# Patient Record
Sex: Male | Born: 2000 | Race: White | Hispanic: No | Marital: Single | Smoking: Never smoker
Health system: Southern US, Community
[De-identification: ages and names within clinical notes are randomized; demographics above are authoritative.]

## PROBLEM LIST (undated history)

## (undated) DIAGNOSIS — F909 Attention-deficit hyperactivity disorder, unspecified type: Secondary | ICD-10-CM

## (undated) HISTORY — DX: Attention-deficit hyperactivity disorder, unspecified type: F90.9

---
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
## 2021-06-15 ENCOUNTER — Encounter (HOSPITAL_COMMUNITY): Payer: Self-pay | Admitting: Emergency Medicine

## 2021-06-15 ENCOUNTER — Emergency Department (HOSPITAL_COMMUNITY)
Admission: EM | Admit: 2021-06-15 | Discharge: 2021-06-15 | Disposition: A | Discharge status: Home / Self Care | Payer: 59 | Attending: Emergency Medicine | Admitting: Emergency Medicine

## 2021-06-15 DIAGNOSIS — R040 Epistaxis: Secondary | ICD-10-CM | POA: Diagnosis not present

## 2021-06-15 NOTE — ED Provider Notes (Signed)
[facility] EMERGENCY DEPARTMENT Provider Note   CSN: [id number] Arrival date & time: 06/15/21  0623     History No chief complaint on file.   Cody Chandler is a 20 y.o. male presenting for evaluation of recurrent nosebleeds.  Patient states he has had 4 nosebleeds this week.  No associated nasal congestion or URI symptoms.  No trauma.  Bleeding will last for few minutes, stop with direct pressure.  No previous history of bleeding.  Occasionally after bleeding, more evaluated right ankle headache, but this is not present currently.  No vision changes.  No numbness or tingling.  He has no other medical problems, takes medications daily.  Not on blood thinners.  HPI     Past Medical History:  Diagnosis Date   ADHD (attention deficit hyperactivity disorder)     There are no problems to display for this patient.   History reviewed. No pertinent surgical history.     No family history on file.  Social History   Tobacco Use   Smoking status: Never   Smokeless tobacco: Never  Substance Use Topics   Alcohol use: No   Drug use: No    Home Medications Prior to Admission medications   Medication Sig Start Date End Date Taking? Authorizing Provider  acetaminophen (TYLENOL) 325 MG tablet Take 2 tablets (650 mg total) by mouth every 6 (six) hours as needed for mild pain or moderate pain. 08/02/18   Sherrilee Gilles, NP  amphetamine-dextroamphetamine (ADDERALL XR) 20 MG 24 hr capsule Take 20 mg by mouth daily.    [provider]  ibuprofen (ADVIL,MOTRIN) 600 MG tablet Take 1 tablet (600 mg total) by mouth every 6 (six) hours as needed for mild pain or moderate pain. 08/02/18   Sherrilee Gilles, NP    Allergies    Patient has no known allergies.  Review of Systems   Review of Systems  HENT:  Positive for nosebleeds (resolved).   Neurological:  Positive for headaches (resolved).   Physical Exam Updated Vital Signs BP (!) 146/74 (BP  Location: Right Arm)   Pulse 79   Temp 98.2 F (36.8 C) (Oral)   Resp 14   SpO2 100%   Physical Exam Vitals and nursing note reviewed.  Constitutional:      General: He is not in acute distress.    Appearance: Normal appearance.     Comments: Sitting in the bed in NAD  HENT:     Head: Normocephalic and atraumatic.     Nose:     Comments: No active bleeding.  No laceration or obvious source of bleeding. Eyes:     Conjunctiva/sclera: Conjunctivae normal.     Pupils: Pupils are equal, round, and reactive to light.  Cardiovascular:     Rate and Rhythm: Normal rate and regular rhythm.     Pulses: Normal pulses.  Pulmonary:     Effort: Pulmonary effort is normal. No respiratory distress.     Breath sounds: Normal breath sounds. No wheezing.     Comments: Speaking in full sentences.  Clear lung sounds in all fields. Abdominal:     General: There is no distension.     Palpations: Abdomen is soft.     Tenderness: There is no abdominal tenderness.  Musculoskeletal:        General: Normal range of motion.     Cervical back: Normal range of motion and neck supple.  Skin:    General: Skin is warm and dry.  Capillary Refill: Capillary refill takes less than 2 seconds.  Neurological:     Mental Status: He is alert and oriented to person, place, and time.  Psychiatric:        Mood and Affect: Mood and affect normal.        Speech: Speech normal.        Behavior: Behavior normal.    ED Results / Procedures / Treatments   Labs (all labs ordered are listed, but only abnormal results are displayed) Labs Reviewed - No data to display  EKG None  Radiology No results found.  Procedures Procedures   Medications Ordered in ED Medications - No data to display  ED Course  I have reviewed the triage vital signs and the nursing notes.  Pertinent labs & imaging results that were available during my care of the patient were reviewed by me and considered in my medical decision  making (see chart for details).    MDM Rules/Calculators/A&P                           Patient presenting for evaluation of intermittent recurrent nosebleeds.  On exam, patient appears nontoxic.  No active bleeding.  No headache currently.  No other associated neurologic symptoms.  As such, doubt dural sinus thrombosis or other emergent vascular abnormality.  Discussed treatment of nosebleeds with patient as well as warning signs.  Encouraged patient to follow-up with ear nose and throat.  At this time, patient appears safe for discharge.  Return precautions given.  Patient states he understands and agrees to plan  Final Clinical Impression(s) / ED Diagnoses Final diagnoses:  Epistaxis    Rx / DC Orders ED Discharge Orders     None        Alveria Apley, PA-C 06/15/21 1650    Terald Sleeper, MD 06/16/21 [phone]

## 2021-06-15 NOTE — Discharge Instructions (Addendum)
Use afrin and direct pressure if bleeding recurs.  Call the ear, nose and throat doctor listed below to set up a follow-up appointment. If you have bleeding that lasts for more than 20 minutes after using Afrin and direct pressure, return to the ER.  If you develop severe worsening headaches, dizziness, lightheadedness, vision changes, return to the ER.  Return with any new, worsening, concerning symptoms.

## 2021-06-15 NOTE — ED Triage Notes (Signed)
Pt reports nosebleed x 4 since yesterday.  Bleeding stops with direct pressure.  No bleeding at present.  Reports R sided headache with nosebleed.

## 2021-06-15 NOTE — ED Notes (Signed)
Discharged by PA at triage. 

## 2022-04-16 ENCOUNTER — Other Ambulatory Visit: Payer: Self-pay

## 2022-04-16 ENCOUNTER — Emergency Department
Admission: EM | Admit: 2022-04-16 | Discharge: 2022-04-16 | Disposition: A | Discharge status: Home / Self Care | Payer: Self-pay | Attending: Emergency Medicine | Admitting: Emergency Medicine

## 2022-04-16 ENCOUNTER — Emergency Department: Payer: Self-pay

## 2022-04-16 DIAGNOSIS — W228XXA Striking against or struck by other objects, initial encounter: Secondary | ICD-10-CM | POA: Insufficient documentation

## 2022-04-16 DIAGNOSIS — S0990XA Unspecified injury of head, initial encounter: Secondary | ICD-10-CM | POA: Insufficient documentation

## 2022-04-16 MED ORDER — ONDANSETRON 4 MG PO TBDP
4.0000 mg | ORAL_TABLET | Freq: Once | ORAL | Status: AC
  Administered 2022-04-16: 4 mg via ORAL

## 2022-04-16 MED ORDER — ONDANSETRON 4 MG PO TBDP: 4.0000 mg | ORAL_TABLET | Freq: Three times a day (TID) | ORAL | 0 refills | Status: DC | PRN

## 2022-04-16 MED ORDER — KETOROLAC TROMETHAMINE 60 MG/2ML IM SOLN
30.0000 mg | Freq: Once | INTRAMUSCULAR | Status: AC
  Administered 2022-04-16: 30 mg via INTRAMUSCULAR
  Filled 2022-04-16: qty 2

## 2022-04-16 MED ORDER — ONDANSETRON 4 MG PO TBDP
2.0000 mg | ORAL_TABLET | Freq: Once | ORAL | Status: DC
  Filled 2022-04-16: qty 1

## 2022-04-16 NOTE — ED Triage Notes (Signed)
Pt here after hitting his head several times. Pt states he hit it on a shelf Sun, a bed frame fell on it yesterday, and hit it on his dog's crate today. Pt cc/o head pain. Pt ambulatory to triage.

## 2022-04-16 NOTE — Discharge Instructions (Signed)
Please take Tylenol or ibuprofen for headache.  Use the Zofran if needed for nausea.  Do not drive or operate heavy machinery if you are experiencing any concerns such as dizziness.  Return to the emergency department for symptoms that change or worsen if you are unable to schedule an appointment with primary care.

## 2022-04-16 NOTE — ED Provider Notes (Signed)
[facility] Provider Note    Event Date/Time   First MD Initiated Contact with Patient 04/16/22 1509     (approximate)   History   Headache   HPI  Cody Chandler is a 21 y.o. male with no significant past medical history presents to the emergency department for treatment and evaluation after he has his head 3 separate times over the past 3 days.  He states that he hit the back of his helmet on the shelf on Sunday then a bed frame fell on it yesterday and then today, he hit it on his dogs crate.  He has a frontal headache.  He states that he has vomited approximately 4 times.  He has felt dizzy intermittently since hitting his head the first time.  He states that his vision appears to be "like in 3D."  No previous headache syndrome/migraines.  He has not lost consciousness with any of these incidents.  He denies feeling confused or off balance.  No alleviating measures attempted prior to arrival.  Past Medical History:  Diagnosis Date   ADHD (attention deficit hyperactivity disorder)        Physical Exam   Triage Vital Signs: ED Triage Vitals [04/16/22 1444]  Enc Vitals Group     BP 130/88     Pulse Rate 73     Resp 16     Temp 98.3 F (36.8 C)     Temp Source Oral     SpO2 100 %     Weight 175 lb (79.4 kg)     Height 5\' 10"  (1.778 m)     Head Circumference      Peak Flow      Pain Score 4     Pain Loc      Pain Edu?      Excl. in GC?     Most recent vital signs: Vitals:   04/16/22 1444  BP: 130/88  Pulse: 73  Resp: 16  Temp: 98.3 F (36.8 C)  SpO2: 100%    General: Awake, no distress.  CV:  Good peripheral perfusion.  Resp:  Normal effort.  Abd:  No distention.  Other:  Neuro exam unremarkable.  Pupils equal, round, reactive to light and brisk.  Negative Romberg.  Motor and sensory function is normal.  Demonstrates steady, unassisted gait.   ED Results / Procedures / Treatments   Labs (all labs ordered are listed, but only  abnormal results are displayed) Labs Reviewed - No data to display   EKG  Not indicated   RADIOLOGY  CT of the head is negative for acute abnormality.  I have independently reviewed and interpreted imaging as well as reviewed report from radiology.  PROCEDURES:  Critical Care performed: No  Procedures   MEDICATIONS ORDERED IN ED:  Medications  ketorolac (TORADOL) injection 30 mg (30 mg Intramuscular Given 04/16/22 1532)  ondansetron (ZOFRAN-ODT) disintegrating tablet 4 mg (4 mg Oral Given 04/16/22 1532)     IMPRESSION / MDM / ASSESSMENT AND PLAN / ED COURSE   I reviewed the triage vital signs and the nursing notes.                              Differential diagnosis includes, but is not limited to: ICH, brain mass, lesion, concussion, minor head injury  Patient's presentation is most consistent with acute complicated illness / injury requiring diagnostic workup.  21 year old male presenting to the  emergency department for treatment and evaluation of headache with nausea and vomiting after striking his head 3 times over the past 3 days.  See HPI for further details.  Neuro exam is unremarkable and reassuring.  CT of the head is negative for acute findings.  Symptoms most likely related to concussion syndrome and he will be treated symptomatically.  He will be given an injection of Toradol for his headache while here in the ER and Zofran for the nausea.  Prescriptions for Naprosyn and Zofran will be prescribed.  Headache is completely resolved after Toradol and he denies nausea after Zofran.  Prescription for Zofran submitted to the pharmacy and he will take Tylenol or ibuprofen if needed for headache.  Work excuse provided and he was encouraged not to drive or operate heavy machinery if he feels dizziness.  For symptoms that change or worsen he is to return to the emergency department.     FINAL CLINICAL IMPRESSION(S) / ED DIAGNOSES   Final diagnoses:  Minor head  injury, initial encounter     Rx / DC Orders   ED Discharge Orders          Ordered    ondansetron (ZOFRAN-ODT) 4 MG disintegrating tablet  Every 8 hours PRN        04/16/22 1636             Note:  This document was prepared using Dragon voice recognition software and may include unintentional dictation errors.   Chinita Pester, FNP 04/16/22 1638    Delton Prairie, MD 04/16/22 1655

## 2022-06-23 ENCOUNTER — Emergency Department (HOSPITAL_BASED_OUTPATIENT_CLINIC_OR_DEPARTMENT_OTHER)
Admission: EM | Admit: 2022-06-23 | Discharge: 2022-06-23 | Disposition: A | Discharge status: Home / Self Care | Payer: Self-pay | Attending: Emergency Medicine | Admitting: Emergency Medicine

## 2022-06-23 ENCOUNTER — Encounter (HOSPITAL_BASED_OUTPATIENT_CLINIC_OR_DEPARTMENT_OTHER): Payer: Self-pay | Admitting: Emergency Medicine

## 2022-06-23 ENCOUNTER — Other Ambulatory Visit: Payer: Self-pay

## 2022-06-23 DIAGNOSIS — R519 Headache, unspecified: Secondary | ICD-10-CM | POA: Insufficient documentation

## 2022-06-23 DIAGNOSIS — R55 Syncope and collapse: Secondary | ICD-10-CM | POA: Insufficient documentation

## 2022-06-23 LAB — COMPREHENSIVE METABOLIC PANEL
ALT: 12 U/L (ref 0–44)
AST: 17 U/L (ref 15–41)
Albumin: 4.7 g/dL (ref 3.5–5.0)
Alkaline Phosphatase: 43 U/L (ref 38–126)
Anion gap: 10 (ref 5–15)
BUN: 9 mg/dL (ref 6–20)
CO2: 27 mmol/L (ref 22–32)
Calcium: 9.2 mg/dL (ref 8.9–10.3)
Chloride: 102 mmol/L (ref 98–111)
Creatinine, Ser: 1.05 mg/dL (ref 0.61–1.24)
GFR, Estimated: >60 mL/min (ref >60)
Glucose, Bld: 75 mg/dL (ref 70–99)
Potassium: 3.9 mmol/L (ref 3.5–5.1)
Sodium: 139 mmol/L (ref 135–145)
Total Bilirubin: 1.3 mg/dL — ABNORMAL HIGH (ref 0.3–1.2)
Total Protein: 7.7 g/dL (ref 6.5–8.1)

## 2022-06-23 LAB — CBC WITH DIFFERENTIAL/PLATELET
Abs Immature Granulocytes: 0.01 10*3/uL (ref 0.00–0.07)
Basophils Absolute: 0 10*3/uL (ref 0.0–0.1)
Basophils Relative: 1 %
Eosinophils Absolute: 0.1 10*3/uL (ref 0.0–0.5)
Eosinophils Relative: 2 %
HCT: 44.9 % (ref 39.0–52.0)
Hemoglobin: 15.2 g/dL (ref 13.0–17.0)
Immature Granulocytes: 0 %
Lymphocytes Relative: 28 %
Lymphs Abs: 1.7 10*3/uL (ref 0.7–4.0)
MCH: 29.8 pg (ref 26.0–34.0)
MCHC: 33.9 g/dL (ref 30.0–36.0)
MCV: 88 fL (ref 80.0–100.0)
Monocytes Absolute: 1.1 10*3/uL — ABNORMAL HIGH (ref 0.1–1.0)
Monocytes Relative: 18 %
Neutro Abs: 3.1 10*3/uL (ref 1.7–7.7)
Neutrophils Relative %: 51 %
Platelets: 211 10*3/uL (ref 150–400)
RBC: 5.1 MIL/uL (ref 4.22–5.81)
RDW: 13.2 % (ref 11.5–15.5)
WBC: 5.9 10*3/uL (ref 4.0–10.5)
nRBC: 0 % (ref 0.0–0.2)

## 2022-06-23 MED ORDER — SODIUM CHLORIDE 0.9 % IV BOLUS
1000.0000 mL | Freq: Once | INTRAVENOUS | Status: AC
  Administered 2022-06-23: 1000 mL via INTRAVENOUS

## 2022-06-23 MED ORDER — DIPHENHYDRAMINE HCL 50 MG/ML IJ SOLN
25.0000 mg | Freq: Once | INTRAMUSCULAR | Status: AC
  Administered 2022-06-23: 25 mg via INTRAVENOUS
  Filled 2022-06-23: qty 1

## 2022-06-23 MED ORDER — METOCLOPRAMIDE HCL 5 MG/ML IJ SOLN
10.0000 mg | Freq: Once | INTRAMUSCULAR | Status: AC
  Administered 2022-06-23: 10 mg via INTRAVENOUS
  Filled 2022-06-23: qty 2

## 2022-06-23 NOTE — ED Triage Notes (Signed)
Pt arrives to ED with c/o migraine over the past four days. He reports this started while he was playing golf. Associated symptoms include epistaxis.

## 2022-06-23 NOTE — Discharge Instructions (Addendum)
Please read and follow all provided instructions.  Your diagnoses today include:  1. Acute nonintractable headache, unspecified headache type   2. Syncope, unspecified syncope type     Tests performed today include: Vital signs. See below for your results today.  Complete blood cell count: no problems Basic metabolic panel: no problems EKG: no abnormal heart rhythms  Medications:  In the Emergency Department you received: Reglan - antinausea/headache medication Benadryl - antihistamine to counteract potential side effects of reglan  Take any prescribed medications only as directed.  Additional information:  Follow any educational materials contained in this packet.  You are having a headache. No specific cause was found today for your headache. It may have been a migraine or other cause of headache. Stress, anxiety, fatigue, and depression are common triggers for headaches.   Your headache today does not appear to be life-threatening or require hospitalization, but often the exact cause of headaches is not determined in the emergency department. Therefore, follow-up with your doctor is very important to find out what may have caused your headache and whether or not you need any further diagnostic testing or treatment.   Sometimes headaches can appear benign (not harmful), but then more serious symptoms can develop which should prompt an immediate re-evaluation by your doctor or the emergency department.  BE VERY CAREFUL not to take multiple medicines containing Tylenol (also called acetaminophen). Doing so can lead to an overdose which can damage your liver and cause liver failure and possibly death.   Follow-up instructions: Please follow-up with your primary care provider in the next 3 days for further evaluation of your symptoms.   Return instructions:  Please return to the Emergency Department if you experience worsening symptoms. Return if the medications do not resolve your  headache, if it recurs, or if you have multiple episodes of vomiting or cannot keep down fluids. Return if you have a change from the usual headache. RETURN IMMEDIATELY IF you: Develop a sudden, severe headache Develop confusion or become poorly responsive or faint Develop a fever above 100.75F or problem breathing Have a change in speech, vision, swallowing, or understanding Develop new weakness, numbness, tingling, incoordination in your arms or legs Have a seizure Please return if you have any other emergent concerns.  Additional Information:  Your vital signs today were: BP 130/86 (BP Location: Right Arm)   Pulse 98   Temp 98.4 F (36.9 C) (Oral)   Resp 16   Ht 5\' 10"  (1.778 m)   Wt 79.4 kg   SpO2 100%   BMI 25.12 kg/m  If your blood pressure (BP) was elevated above 135/85 this visit, please have this repeated by your doctor within one month. --------------

## 2022-06-23 NOTE — ED Provider Notes (Signed)
[facility] EMERGENCY DEPT Provider Note   CSN: [id number] Arrival date & time: 06/23/22  1242     History  Chief Complaint  Patient presents with   Migraine    Cody Chandler is a 21 y.o. male.  Patient presents with concern of "migraine" x 4 days and nose bleeding x 2 days, and "blacking out" yesterday. He reports the HA onset occurred when he was golfing, has gradually worsened, and is constant. He reports the pain is predominately in his forehead but changes location with movement. States that after he bent down while playing golf, he developed a throbbing sensation in his head which then resolved.  It reoccured several times later but now is more constant. He reports sensitivity to light, nausea yesterday. He denies sensitivity to sound, vomiting, abdominal pain.  Concerning the "blacking out", he reports he had a prodrome of lightheadedness and tunnel vision.  He does not feel he lost consciousness since he was still standing. He denies hitting his head. No acute of abrupt worsening of HA with this episode. No associated chest pain, shortness of breath, or palpitations. Concerning his nose bleeding, he reports it is not continuously but states it is dripping frequently. He reports a history of migraines about 10 years ago for which he took medicine, but is unable to recall the diagnosis or medicine. He reports a history of nose bleeding everyday for about 2 weeks last year.        Home Medications Prior to Admission medications   Medication Sig Start Date End Date Taking? Authorizing Provider  acetaminophen (TYLENOL) 325 MG tablet Take 2 tablets (650 mg total) by mouth every 6 (six) hours as needed for mild pain or moderate pain. 08/02/18   Sherrilee Gilles, NP  amphetamine-dextroamphetamine (ADDERALL XR) 20 MG 24 hr capsule Take 20 mg by mouth daily.    [provider]  ibuprofen (ADVIL,MOTRIN) 600 MG tablet Take 1 tablet (600 mg total) by mouth every 6 (six)  hours as needed for mild pain or moderate pain. 08/02/18   Scoville, Nadara Mustard, NP  ondansetron (ZOFRAN-ODT) 4 MG disintegrating tablet Take 1 tablet (4 mg total) by mouth every 8 (eight) hours as needed for nausea or vomiting. 04/16/22   Chinita Pester, FNP      Allergies    Patient has no known allergies.    Review of Systems   Review of Systems  Physical Exam Updated Vital Signs BP 130/86 (BP Location: Right Arm)   Pulse 98   Temp 98.4 F (36.9 C) (Oral)   Resp 16   Ht 5\' 10"  (1.778 m)   Wt 79.4 kg   SpO2 100%   BMI 25.12 kg/m   Physical Exam Vitals and nursing note reviewed.  Constitutional:      Appearance: He is well-developed.  HENT:     Head: Normocephalic and atraumatic.     Right Ear: External ear normal.     Left Ear: External ear normal.     Nose: Nose normal.     Comments: Inflamed nasal mucosa bilaterally.  Clot adhered to the left anterior septum noted.  No active bleeding.    Mouth/Throat:     Mouth: Mucous membranes are moist.     Pharynx: Uvula midline.  Eyes:     General: Lids are normal.     Conjunctiva/sclera: Conjunctivae normal.     Pupils: Pupils are equal, round, and reactive to light.     Comments: No visible hyphema  Neck:  Comments: Full range of motion, no signs of meningismus. Cardiovascular:     Rate and Rhythm: Normal rate and regular rhythm.  Pulmonary:     Effort: Pulmonary effort is normal.     Breath sounds: Normal breath sounds.  Abdominal:     Palpations: Abdomen is soft.     Tenderness: There is no abdominal tenderness. There is no guarding or rebound.  Musculoskeletal:        General: Normal range of motion.     Cervical back: Normal range of motion and neck supple. No tenderness or bony tenderness.     Thoracic back: No tenderness or bony tenderness.     Lumbar back: No tenderness or bony tenderness.  Skin:    General: Skin is warm and dry.  Neurological:     Mental Status: He is alert and oriented to person,  place, and time.     GCS: GCS eye subscore is 4. GCS verbal subscore is 5. GCS motor subscore is 6.     Cranial Nerves: No cranial nerve deficit.     Sensory: No sensory deficit.     Motor: No abnormal muscle tone.     Coordination: Coordination normal.     Gait: Gait normal.     ED Results / Procedures / Treatments   Labs (all labs ordered are listed, but only abnormal results are displayed) Labs Reviewed  CBC WITH DIFFERENTIAL/PLATELET - Abnormal; Notable for the following components:      Result Value   Monocytes Absolute 1.1 (*)    All other components within normal limits  COMPREHENSIVE METABOLIC PANEL - Abnormal; Notable for the following components:   Total Bilirubin 1.3 (*)    All other components within normal limits    ED ECG REPORT   Date: 06/23/2022  Rate: 81  Rhythm: normal sinus rhythm  QRS Axis: normal  Intervals: normal  ST/T Wave abnormalities: nonspecific ST/T changes  Conduction Disutrbances:none  Narrative Interpretation: no prolonged QT, signs of WPW, Brugada syndrome, heart block, signs of hypertrophy  Old EKG Reviewed: changes noted  I have personally reviewed the EKG tracing and agree with the computerized printout as noted.   Radiology No results found.  Procedures Procedures    Medications Ordered in ED Medications  metoCLOPramide (REGLAN) injection 10 mg (10 mg Intravenous Given 06/23/22 1410)  diphenhydrAMINE (BENADRYL) injection 25 mg (25 mg Intravenous Given 06/23/22 1406)  sodium chloride 0.9 % bolus 1,000 mL (0 mLs Intravenous Stopped 06/23/22 1527)    ED Course/ Medical Decision Making/ A&P    Patient seen and examined. History obtained directly from patient. Work-up including labs, imaging, EKG ordered in triage, if performed, were reviewed.  Reviewed CT head imaging results from earlier this year.   Labs/EKG: Independently reviewed and interpreted.  This included: CBC with no concerning findings; CMP with no concerning findings.    Imaging: None ordered  Medications/Fluids: IV Reglan, IV Benadryl, IV fluid bolus  Most recent vital signs reviewed and are as follows: BP 102/61 (BP Location: Left Arm)   Pulse 70   Temp 98 F (36.7 C) (Oral)   Resp 20   Ht 5\' 10"  (1.778 m)   Wt 79.4 kg   SpO2 99%   BMI 25.12 kg/m   Initial impression: Headache  3:51 PM Reassessment performed. Patient appears comfortable.  States that he is feeling improved.  States that he is ready to go home.  Labs personally reviewed and interpreted including: EKG reviewed.  No concerning heart rhythms  or other findings, does have a new T wave inversion compared to previous.  No signs of ischemia.  Reviewed pertinent lab work and imaging with patient at bedside. Questions answered.   Most current vital signs reviewed and are as follows: BP 102/61 (BP Location: Left Arm)   Pulse 70   Temp 98 F (36.7 C) (Oral)   Resp 20   Ht 5\' 10"  (1.778 m)   Wt 79.4 kg   SpO2 99%   BMI 25.12 kg/m   Plan: Rest, hydration, PCP follow-up. Discharge to home.   Prescriptions written for: None  Other home care instructions discussed: Avoidance of triggers.   ED return instructions discussed: Patient counseled to return if they have weakness in their arms or legs, slurred speech, trouble walking or talking, confusion, trouble with their balance, or if they have any other concerns. Patient verbalizes understanding and agrees with plan.   Counseled to return with CP, SOB, additional episodes of passing out.   Follow-up instructions discussed: Patient encouraged to follow-up with their PCP in 5 days.                           Medical Decision Making Amount and/or Complexity of Data Reviewed Labs: ordered.  Risk Prescription drug management.   In regards to the patient's headache, critical differentials were considered including subarachnoid hemorrhage, intracerebral hemorrhage, epidural/subdural hematoma, pituitary apoplexy, vertebral/carotid  artery dissection, giant cell arteritis, central venous thrombosis, reversible cerebral vasoconstriction, acute angle closure glaucoma, idiopathic intracranial hypertension, bacterial meningitis, viral encephalitis, carbon monoxide poisoning, posterior reversible encephalopathy syndrome, pre-eclampsia.   Reg flag symptoms related to these causes were considered including systemic symptoms (fever, weight loss), neurologic symptoms (confusion, mental status change, vision change, associated seizure), acute or sudden "thunderclap" onset, patient age 18 or older with new or progressive headache, patient of any age with first headache or change in headache pattern, pregnant or postpartum status, history of HIV or other immunocompromise, history of cancer, headache occurring with exertion, associated neck or shoulder pain, associated traumatic injury, concurrent use of anticoagulation, family history of spontaneous SAH, and concurrent drug use.    Other benign, more common causes of headache were considered including migraine, tension-type headache, cluster headache, referred pain from other cause such as sinus infection, dental pain, trigeminal neuralgia.   On exam, patient has a reassuring neuro exam including baseline mental status, no significant neck pain or meningeal signs, no signs of severe infection or fever in the ED.  No thunderclap onset.  No persistent confusion or focal neuro findings.  Patient also reports episode of syncope yesterday.  This was not related to worsening of headache.  He had positive prodrome.  EKG without prolonged QT, signs of Brugada syndrome or WPW, heart block, other arrhythmia.  No associated chest pain to suggest PE.  Low concern for myocarditis without history of chest pain.  Incidental T wave changes noted.   The patient's vital signs, pertinent lab work and imaging were reviewed and interpreted as discussed in the ED course. Hospitalization was considered for further  testing, treatments, or serial exams/observation. However as patient is well-appearing, has a stable exam over the course of their evaluation, and reassuring studies today, I do not feel that they warrant admission at this time. This plan was discussed with the patient who verbalizes agreement and comfort with this plan and seems reliable and able to return to the Emergency Department with worsening or changing symptoms.  Final Clinical Impression(s) / ED Diagnoses Final diagnoses:  Acute nonintractable headache, unspecified headache type  Syncope, unspecified syncope type    Rx / DC Orders ED Discharge Orders     None         Renne Crigler, PA-C 06/23/22 1556    Terald Sleeper, MD 06/23/22 1739

## 2023-04-13 DIAGNOSIS — Z113 Encounter for screening for infections with a predominantly sexual mode of transmission: Secondary | ICD-10-CM | POA: Diagnosis not present

## 2023-04-13 DIAGNOSIS — Z7251 High risk heterosexual behavior: Secondary | ICD-10-CM | POA: Diagnosis not present

## 2023-04-13 DIAGNOSIS — N50819 Testicular pain, unspecified: Secondary | ICD-10-CM | POA: Diagnosis not present

## 2023-06-12 ENCOUNTER — Encounter (HOSPITAL_BASED_OUTPATIENT_CLINIC_OR_DEPARTMENT_OTHER): Payer: Self-pay

## 2023-06-12 ENCOUNTER — Emergency Department (HOSPITAL_BASED_OUTPATIENT_CLINIC_OR_DEPARTMENT_OTHER): Payer: BC Managed Care – PPO | Admitting: Radiology

## 2023-06-12 ENCOUNTER — Observation Stay (HOSPITAL_BASED_OUTPATIENT_CLINIC_OR_DEPARTMENT_OTHER)
Admission: EM | Admit: 2023-06-12 | Discharge: 2023-06-13 | Disposition: A | Discharge status: Home / Self Care | Payer: BC Managed Care – PPO | Attending: Internal Medicine | Admitting: Internal Medicine

## 2023-06-12 ENCOUNTER — Other Ambulatory Visit: Payer: Self-pay

## 2023-06-12 DIAGNOSIS — Z1152 Encounter for screening for COVID-19: Secondary | ICD-10-CM | POA: Insufficient documentation

## 2023-06-12 DIAGNOSIS — Z79899 Other long term (current) drug therapy: Secondary | ICD-10-CM | POA: Diagnosis not present

## 2023-06-12 DIAGNOSIS — R7989 Other specified abnormal findings of blood chemistry: Secondary | ICD-10-CM

## 2023-06-12 DIAGNOSIS — R0781 Pleurodynia: Secondary | ICD-10-CM

## 2023-06-12 DIAGNOSIS — R569 Unspecified convulsions: Secondary | ICD-10-CM

## 2023-06-12 DIAGNOSIS — R072 Precordial pain: Secondary | ICD-10-CM | POA: Diagnosis not present

## 2023-06-12 DIAGNOSIS — R0789 Other chest pain: Secondary | ICD-10-CM | POA: Diagnosis not present

## 2023-06-12 DIAGNOSIS — I309 Acute pericarditis, unspecified: Secondary | ICD-10-CM | POA: Diagnosis not present

## 2023-06-12 DIAGNOSIS — I319 Disease of pericardium, unspecified: Secondary | ICD-10-CM | POA: Diagnosis present

## 2023-06-12 LAB — BASIC METABOLIC PANEL
Anion gap: 12 (ref 5–15)
BUN: 12 mg/dL (ref 6–20)
CO2: 28 mmol/L (ref 22–32)
Calcium: 10.6 mg/dL — ABNORMAL HIGH (ref 8.9–10.3)
Chloride: 95 mmol/L — ABNORMAL LOW (ref 98–111)
Creatinine, Ser: 0.9 mg/dL (ref 0.61–1.24)
GFR, Estimated: >60 mL/min (ref >60)
Glucose, Bld: 103 mg/dL — ABNORMAL HIGH (ref 70–99)
Potassium: 4.2 mmol/L (ref 3.5–5.1)
Sodium: 135 mmol/L (ref 135–145)

## 2023-06-12 LAB — CBC
HCT: 44.7 % (ref 39.0–52.0)
Hemoglobin: 15.4 g/dL (ref 13.0–17.0)
MCH: 30.4 pg (ref 26.0–34.0)
MCHC: 34.5 g/dL (ref 30.0–36.0)
MCV: 88.2 fL (ref 80.0–100.0)
Platelets: 224 10*3/uL (ref 150–400)
RBC: 5.07 MIL/uL (ref 4.22–5.81)
RDW: 13.2 % (ref 11.5–15.5)
WBC: 7.5 10*3/uL (ref 4.0–10.5)
nRBC: 0 % (ref 0.0–0.2)

## 2023-06-12 LAB — D-DIMER, QUANTITATIVE: D-Dimer, Quant: <0.27 ug/mL-FEU (ref 0.00–0.50)

## 2023-06-12 LAB — TROPONIN I (HIGH SENSITIVITY)
Troponin I (High Sensitivity): 23 ng/L — ABNORMAL HIGH (ref <18)
Troponin I (High Sensitivity): 31 ng/L — ABNORMAL HIGH (ref <18)
Troponin I (High Sensitivity): 44 ng/L — ABNORMAL HIGH (ref <18)
Troponin I (High Sensitivity): 58 ng/L — ABNORMAL HIGH (ref <18)

## 2023-06-12 LAB — SARS CORONAVIRUS 2 BY RT PCR: SARS Coronavirus 2 by RT PCR: NEGATIVE

## 2023-06-12 MED ORDER — KETOROLAC TROMETHAMINE 30 MG/ML IJ SOLN
30.0000 mg | Freq: Once | INTRAMUSCULAR | Status: AC
Start: 2023-06-12 — End: 2023-06-12
  Administered 2023-06-12: 30 mg via INTRAVENOUS
  Filled 2023-06-12: qty 1

## 2023-06-12 MED ORDER — PANTOPRAZOLE SODIUM 40 MG PO TBEC
40.0000 mg | DELAYED_RELEASE_TABLET | Freq: Every day | ORAL | Status: DC
  Administered 2023-06-12: 40 mg via ORAL
  Filled 2023-06-12: qty 1

## 2023-06-12 MED ORDER — MORPHINE SULFATE (PF) 2 MG/ML IV SOLN: 1.0000 mg | INTRAVENOUS | Status: DC | PRN

## 2023-06-12 MED ORDER — IBUPROFEN 600 MG PO TABS
600.0000 mg | ORAL_TABLET | Freq: Three times a day (TID) | ORAL | Status: DC
  Administered 2023-06-12 – 2023-06-13 (×2): 600 mg via ORAL
  Filled 2023-06-12 (×2): qty 1

## 2023-06-12 MED ORDER — COLCHICINE 0.6 MG PO TABS
0.6000 mg | ORAL_TABLET | Freq: Once | ORAL | Status: AC
  Administered 2023-06-12: 0.6 mg via ORAL
  Filled 2023-06-12: qty 1

## 2023-06-12 MED ORDER — COLCHICINE 0.6 MG PO TABS
0.6000 mg | ORAL_TABLET | Freq: Two times a day (BID) | ORAL | Status: DC
  Administered 2023-06-13: 0.6 mg via ORAL
  Filled 2023-06-12: qty 1

## 2023-06-12 MED ORDER — LACTATED RINGERS IV BOLUS
1000.0000 mL | Freq: Once | INTRAVENOUS | Status: AC
  Administered 2023-06-12: 1000 mL via INTRAVENOUS
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
## 2023-06-13 ENCOUNTER — Inpatient Hospital Stay (HOSPITAL_BASED_OUTPATIENT_CLINIC_OR_DEPARTMENT_OTHER): Payer: BC Managed Care – PPO

## 2023-06-13 DIAGNOSIS — R079 Chest pain, unspecified: Secondary | ICD-10-CM

## 2023-06-13 DIAGNOSIS — Z79899 Other long term (current) drug therapy: Secondary | ICD-10-CM | POA: Diagnosis not present

## 2023-06-13 DIAGNOSIS — I300 Acute nonspecific idiopathic pericarditis: Secondary | ICD-10-CM

## 2023-06-13 DIAGNOSIS — R7989 Other specified abnormal findings of blood chemistry: Secondary | ICD-10-CM | POA: Diagnosis not present

## 2023-06-13 DIAGNOSIS — Z1152 Encounter for screening for COVID-19: Secondary | ICD-10-CM | POA: Diagnosis not present

## 2023-06-13 DIAGNOSIS — I309 Acute pericarditis, unspecified: Secondary | ICD-10-CM | POA: Diagnosis not present

## 2023-06-13 DIAGNOSIS — I308 Other forms of acute pericarditis: Secondary | ICD-10-CM

## 2023-06-13 LAB — ECHOCARDIOGRAM COMPLETE
Area-P 1/2: 2.51 cm2
Height: 70 in
S' Lateral: 3.5 cm
Weight: 3027.2 oz

## 2023-06-13 LAB — TROPONIN I (HIGH SENSITIVITY): Troponin I (High Sensitivity): 89 ng/L — ABNORMAL HIGH (ref <18)

## 2023-06-13 LAB — HIV ANTIBODY (ROUTINE TESTING W REFLEX): HIV Screen 4th Generation wRfx: NONREACTIVE

## 2023-06-13 MED ORDER — ACETAMINOPHEN 325 MG PO TABS: 650.0000 mg | ORAL_TABLET | Freq: Four times a day (QID) | ORAL | Status: DC | PRN

## 2023-06-13 MED ORDER — IBUPROFEN 600 MG PO TABS: 600.0000 mg | ORAL_TABLET | Freq: Three times a day (TID) | ORAL | 0 refills | Status: DC

## 2023-06-13 MED ORDER — HYDROMORPHONE HCL 1 MG/ML IJ SOLN
0.5000 mg | INTRAMUSCULAR | Status: DC | PRN
  Administered 2023-06-13: 0.5 mg via INTRAVENOUS
  Filled 2023-06-13: qty 1

## 2023-06-13 MED ORDER — AMPHETAMINE-DEXTROAMPHET ER 20 MG PO CP24: 20.0000 mg | ORAL_CAPSULE | Freq: Every day | ORAL | Status: DC

## 2023-06-13 MED ORDER — OXYCODONE HCL 5 MG PO TABS
5.0000 mg | ORAL_TABLET | ORAL | Status: DC | PRN
  Administered 2023-06-13: 5 mg via ORAL
  Filled 2023-06-13: qty 1

## 2023-06-13 MED ORDER — PANTOPRAZOLE SODIUM 40 MG PO TBEC: 40.0000 mg | DELAYED_RELEASE_TABLET | Freq: Every day | ORAL | 0 refills | Status: DC

## 2023-06-13 MED ORDER — COLCHICINE 0.6 MG PO TABS: 0.6000 mg | ORAL_TABLET | Freq: Two times a day (BID) | ORAL | 2 refills | Status: AC

## 2023-06-13 MED ORDER — ONDANSETRON HCL 4 MG PO TABS: 4.0000 mg | ORAL_TABLET | Freq: Four times a day (QID) | ORAL | Status: DC | PRN

## 2023-06-13 MED ORDER — ONDANSETRON HCL 4 MG/2ML IJ SOLN: 4.0000 mg | Freq: Four times a day (QID) | INTRAMUSCULAR | Status: DC | PRN

## 2023-06-13 MED ORDER — ACETAMINOPHEN 650 MG RE SUPP: 650.0000 mg | Freq: Four times a day (QID) | RECTAL | Status: DC | PRN

## 2023-06-13 NOTE — Progress Notes (Signed)
Patient seen and examined today.  Chest pain is improved.  Would continue current dose of colchicine.  Plan for transthoracic echo later today.  Should his echo be normal, can be discharged with follow-up in cardiology clinic.  Loman Brooklyn, MD

## 2023-06-13 NOTE — ED Notes (Signed)
[facility] at bedside to transport pt to [place] 
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
## 2023-06-13 NOTE — Progress Notes (Signed)
Echocardiogram 2D Echocardiogram has been performed.  Warren Lacy Romie Tay RDCS 06/13/2023, 11:03 AM

## 2023-06-13 NOTE — Consult Note (Signed)
Cardiology Consultation   Patient ID: Cody Chandler MRN: [id number]; DOB: [birth year]  Admit date: 06/12/2023 Date of Consult: 06/13/2023  PCP:  Patient, No Pcp Per   [facility] Providers Cardiologist:  None        Patient Profile:   Cody Chandler is a 22 y.o. male with no major medical problems who is being seen 06/13/2023 for the evaluation of chest pain at the request of Dr. Ernie Avena.  History of Present Illness:  Cody Chandler is a 22 y.o. male with no major medical problems who is being seen 06/13/2023 for the evaluation of chest pain   Patient reports sharp left-sided chest pain after waking up yesterday, associated with some shortness of breath, chest pain gets worse with deep breathing.  Denies any cough with recent URTI Reports drinking alcohol/beer last night Denies any other drug use  Per report, amily member reported he had seizure-like episode 10 days ago and appeared to be confused when he woke up however he refused to go to the ER however he does not have any symptoms like this currently.  In the ER he continued to have chest pain despite getting colchicine and ibuprofen, and continued to have elevated troponin, thus transferred here for further evaluation  Past Medical History:  Diagnosis Date   ADHD (attention deficit hyperactivity disorder)     History reviewed. No pertinent surgical history.   Home Medications:  Prior to Admission medications   Medication Sig Start Date End Date Taking? Authorizing Provider  acetaminophen (TYLENOL) 325 MG tablet Take 2 tablets (650 mg total) by mouth every 6 (six) hours as needed for mild pain or moderate pain. 08/02/18   Sherrilee Gilles, NP  amphetamine-dextroamphetamine (ADDERALL XR) 20 MG 24 hr capsule Take 20 mg by mouth daily.    [provider]  ibuprofen (ADVIL,MOTRIN) 600 MG tablet Take 1 tablet (600 mg total) by mouth every 6 (six) hours as needed for mild pain or moderate pain. 08/02/18    Scoville, Nadara Mustard, NP  ondansetron (ZOFRAN-ODT) 4 MG disintegrating tablet Take 1 tablet (4 mg total) by mouth every 8 (eight) hours as needed for nausea or vomiting. 04/16/22   Chinita Pester, FNP    Inpatient Medications: Scheduled Meds:  colchicine  0.6 mg Oral BID   ibuprofen  600 mg Oral TID   pantoprazole  40 mg Oral Q0600   Continuous Infusions:  PRN Meds: HYDROmorphone (DILAUDID) injection, oxyCODONE  Allergies:   No Known Allergies  Social History:   Social History   Socioeconomic History   Marital status: Single    Spouse name: Not on file   Number of children: Not on file   Years of education: Not on file   Highest education level: Not on file  Occupational History   Not on file  Tobacco Use   Smoking status: Never   Smokeless tobacco: Never  Substance and Sexual Activity   Alcohol use: No   Drug use: No   Sexual activity: Never  Other Topics Concern   Not on file  Social History Narrative   Not on file   Social Determinants of Health   Financial Resource Strain: Not on file  Food Insecurity: No Food Insecurity (06/13/2023)   Hunger Vital Sign    Worried About Running Out of Food in the Last Year: Never true    Ran Out of Food in the Last Year: Never true  Transportation Needs: No Transportation Needs (06/13/2023)   PRAPARE -  Administrator, Civil Service (Medical): No    Lack of Transportation (Non-Medical): No  Physical Activity: Not on file  Stress: Not on file  Social Connections: Unknown (01/07/2023)   [facility]   Social Network    Social Network: Not on file  Intimate Partner Violence: Not At Risk (06/13/2023)   Humiliation, Afraid, Rape, and Kick questionnaire    Fear of Current or Ex-Partner: No    Emotionally Abused: No    Physically Abused: No    Sexually Abused: No    Family History:   History reviewed. No pertinent family history.   ROS:  Please see the history of present illness.   All other ROS  reviewed and negative.     Physical Exam/Data:   Vitals:   06/13/23 0130 06/13/23 0200 06/13/23 0230 06/13/23 0341  BP: 123/84 123/83 117/81 (!) 141/97  Pulse: (!) 57 70 (!) 57 (!) 56  Resp: 17 17 16 16   Temp:    98 F (36.7 C)  TempSrc:    Oral  SpO2: 98% 99% 98% 100%  Weight:    85.8 kg  Height:    5\' 10"  (1.778 m)    Intake/Output Summary (Last 24 hours) at 06/13/2023 0612 Last data filed at 06/12/2023 2305 Gross per 24 hour  Intake 1000.3 ml  Output --  Net 1000.3 ml      06/13/2023    3:41 AM 06/12/2023    4:20 PM 06/23/2022   12:53 PM  Last 3 Weights  Weight (lbs) 189 lb 3.2 oz 180 lb 175 lb 0.7 oz  Weight (kg) 85.821 kg 81.647 kg 79.4 kg     Body mass index is 27.15 kg/m.  General:  Well nourished, well developed, in no acute distress HEENT: normal Neck: no JVD Vascular: No carotid bruits; Distal pulses 2+ bilaterally Cardiac:  normal S1, S2; RRR; no murmur  Lungs:  clear to auscultation bilaterally, no wheezing, rhonchi or rales  Abd: soft, nontender, no hepatomegaly  Ext: no edema Musculoskeletal:  No deformities, BUE and BLE strength normal and equal Skin: warm and dry  Neuro:  CNs 2-12 intact, no focal abnormalities noted Psych:  Normal affect   EKG:  The EKG was personally reviewed and demonstrates: Diffuse ST elevation, PR depression, there is PR elevation in aVR and ST depression consistent with acute pericarditis Telemetry:  Telemetry was personally reviewed and demonstrates: Sinus rhythm  Relevant CV Studies:   Laboratory Data:  High Sensitivity Troponin:   Recent Labs  Lab 06/12/23 1632 06/12/23 1811 06/12/23 2010 06/12/23 2158 06/13/23 0050  TROPONINIHS 23* 31* 44* 58* 89*     Chemistry Recent Labs  Lab 06/12/23 1632  NA 135  K 4.2  CL 95*  CO2 28  GLUCOSE 103*  BUN 12  CREATININE 0.90  CALCIUM 10.6*  GFRNONAA >60  ANIONGAP 12    No results for input(s): "PROT", "ALBUMIN", "AST", "ALT", "ALKPHOS", "BILITOT" in the last 168  hours. Lipids No results for input(s): "CHOL", "TRIG", "HDL", "LABVLDL", "LDLCALC", "CHOLHDL" in the last 168 hours.  Hematology Recent Labs  Lab 06/12/23 1632  WBC 7.5  RBC 5.07  HGB 15.4  HCT 44.7  MCV 88.2  MCH 30.4  MCHC 34.5  RDW 13.2  PLT 224   Thyroid No results for input(s): "TSH", "FREET4" in the last 168 hours.  BNPNo results for input(s): "BNP", "PROBNP" in the last 168 hours.  DDimer  Recent Labs  Lab 06/12/23 1632  DDIMER <  0.27     Radiology/Studies:  DG Chest 2 View  Result Date: 06/12/2023 CLINICAL DATA:  Chest pain EXAM: CHEST - 2 VIEW COMPARISON:  X-ray 08/05/2020 and CT scans FINDINGS: The heart size and mediastinal contours are within normal limits. Both lungs are clear. No consolidation, pneumothorax or effusion. No edema. The visualized skeletal structures are unremarkable. Overlapping cardiac leads. IMPRESSION: No acute cardiopulmonary disease Electronically Signed   By: Karen Kays M.D.   On: 06/12/2023 16:55     Assessment and Plan:   Acute pericarditis Elevated troponin (23/31/44/89)-May be some component of myopericarditis, however he is hemodynamically stable Alcohol use disorder and vapes   Risk Assessment/Risk Scores:        - continue colchicine 0.6mg  bid- will need for 3 months, and ibuprofen 600mg  TID for 1 week with GI protection- protonix - ECHO this am. If looks good can go home today. Even though he had elevated troponin he is electrically and hemodynamically stable, is not behaving like myocarditis. He had no viral symptoms prior.  - will follow.        For questions or updates, please contact [facility] Please consult [email or web address] for contact info under    Signed, Elmon Kirschner, MD  06/13/2023 6:12 AM

## 2023-06-14 NOTE — Discharge Summary (Signed)
Physician Discharge Summary   Patient: Cody Chandler MRN: [id number] DOB: 2001-08-15  Admit date:     06/12/2023  Discharge date: 06/13/2023  Discharge Physician: Lynden Oxford  PCP: Patient, No Pcp Per  Recommendations at discharge: Follow-up with cardiology as recommended. Follow-up with PCP in 1 week. Patient was referred to neurology by ER for reported history of seizure-like episodes.  Patient was recommended not to drive until cleared by PCP.   Follow-up Information     PCP. Schedule an appointment as soon as possible for a visit in 1 week(s).          [facility] at [facility]. Schedule an appointment as soon as possible for a visit in 2 week(s).   Specialty: Cardiology Contact information: [address], [address] [place] [address] [phone]                Discharge Diagnoses: Pericarditis.  Chandler Course:  Pericarditis Presents with Chest pain.  EKG shows ST elevation. Concerning for pericarditis. Troponins are elevated but not in the ACS territory. Pain appears to be improving with therapy with colchicine. Cardiology consult appreciated. No further workup recommended after echocardiogram. Echocardiogram showed no evidence of effusion or other acute abnormality. Recommended colchicine which the patient is tolerating it well so far. Continue pain control.  Reported history of seizure-like events. Patient reported to EDP that he had seizure-like events prior to presentation. Currently resolved. Patient was referred to neurology for the same. Recommended not to drive until cleared by PCP.   Consultants:  Cardiology  Procedures performed:  Echocardiogram  DISCHARGE MEDICATION: Allergies as of 06/13/2023   No Known Allergies      Medication List     TAKE these medications    colchicine 0.6 MG tablet Take 1 tablet (0.6 mg total) by mouth 2 (two) times daily.   ibuprofen 600 MG tablet Commonly known  as: ADVIL Take 1 tablet (600 mg total) by mouth with breakfast, with lunch, and with evening meal.   pantoprazole 40 MG tablet Commonly known as: PROTONIX Take 1 tablet (40 mg total) by mouth daily at 6 (six) AM.       Disposition: Home Diet recommendation: Regular diet  Discharge Exam: Vitals:   06/13/23 0200 06/13/23 0230 06/13/23 0341 06/13/23 0835  BP: 123/83 117/81 (!) 141/97 131/87  Pulse: 70 (!) 57 (!) 56 71  Resp: 17 16 16 17   Temp:   98 F (36.7 C) 98 F (36.7 C)  TempSrc:   Oral Oral  SpO2: 99% 98% 100%   Weight:   85.8 kg   Height:   5\' 10"  (1.778 m)    General: Appear in mild distress; no visible Abnormal Neck Mass Or lumps, Conjunctiva normal Cardiovascular: S1 and S2 Present, no Murmur, Respiratory: good respiratory effort, Bilateral Air entry present and CTA, no Crackles, no wheezes Abdomen: Bowel Sound present, Non tender  Extremities: no Pedal edema Neurology: alert and oriented to time, place, and person  Filed Weights   06/12/23 1620 06/13/23 0341  Weight: 81.6 kg 85.8 kg   Condition at discharge: stable  The results of significant diagnostics from this hospitalization (including imaging, microbiology, ancillary and laboratory) are listed below for reference.   Imaging Studies: ECHOCARDIOGRAM COMPLETE  Result Date: 06/13/2023    ECHOCARDIOGRAM REPORT   Patient Name:   Cody Chandler Date of Exam: 06/13/2023 Medical Rec #:  [id number]    Height:       70.0 in Accession #:  [id number]   Weight:       189.2 lb Date of Birth:  2001/06/23    BSA:          2.039 m Patient Age:    22 years     BP:           131/87 mmHg Patient Gender: M            HR:           65 bpm. Exam Location:  Inpatient Procedure: 2D Echo, Color Doppler and Cardiac Doppler Indications:    R07.9* Chest pain, unspecified; I31.3 Pericardial effusion  History:        Patient has no prior history of Echocardiogram examinations.  Sonographer:    Cody Chandler Referring Phys: [id number] Cody Chandler IMPRESSIONS  1. Left ventricular ejection fraction, by estimation, is 60 to 65%. The left ventricle has normal function. The left ventricle has no regional wall motion abnormalities. Left ventricular diastolic parameters were normal.  2. Right ventricular systolic function is normal. The right ventricular size is normal. There is normal pulmonary artery systolic pressure.  3. The mitral valve is normal in structure. No evidence of mitral valve regurgitation.  4. The aortic valve is tricuspid. Aortic valve regurgitation is not visualized.  5. The inferior vena cava is normal in size with greater than 50% respiratory variability, suggesting right atrial pressure of 3 mmHg. FINDINGS  Left Ventricle: Left ventricular ejection fraction, by estimation, is 60 to 65%. The left ventricle has normal function. The left ventricle has no regional wall motion abnormalities. The left ventricular internal cavity size was normal in size. There is  no left ventricular hypertrophy. Left ventricular diastolic parameters were normal. Right Ventricle: The right ventricular size is normal. Right ventricular systolic function is normal. There is normal pulmonary artery systolic pressure. The tricuspid regurgitant velocity is 1.87 m/s, and with an assumed right atrial pressure of 3 mmHg,  the estimated right ventricular systolic pressure is 17.0 mmHg. Left Atrium: Left atrial size was normal in size. Right Atrium: Right atrial size was normal in size. Pericardium: There is no evidence of pericardial effusion. Mitral Valve: The mitral valve is normal in structure. No evidence of mitral valve regurgitation. Tricuspid Valve: The tricuspid valve is normal in structure. Tricuspid valve regurgitation is mild. Aortic Valve: The aortic valve is tricuspid. Aortic valve regurgitation is not visualized. Pulmonic Valve: The pulmonic valve was normal in structure. Pulmonic valve regurgitation is trivial. Aorta: The aortic root and ascending  aorta are structurally normal, with no evidence of dilitation. Venous: The inferior vena cava is normal in size with greater than 50% respiratory variability, suggesting right atrial pressure of 3 mmHg. IAS/Shunts: No atrial level shunt detected by color flow Doppler.  LEFT VENTRICLE PLAX 2D LVIDd:         5.30 cm   Diastology LVIDs:         3.50 cm   LV e' medial:    12.40 cm/s LV PW:         0.70 cm   LV E/e' medial:  5.6 LV IVS:        0.70 cm   LV e' lateral:   12.50 cm/s LVOT diam:     2.50 cm   LV E/e' lateral: 5.6 LV SV:         85 LV SV Index:   42 LVOT Area:     4.91 cm  RIGHT VENTRICLE RV S prime:  10.90 cm/s TAPSE (M-mode): 2.1 cm LEFT ATRIUM           Index        RIGHT ATRIUM           Index LA diam:      2.30 cm 1.13 cm/m   RA Area:     15.80 cm LA Vol (A2C): 23.2 ml 11.38 ml/m  RA Volume:   38.80 ml  19.03 ml/m LA Vol (A4C): 33.0 ml 16.19 ml/m  AORTIC VALVE LVOT Vmax:   88.70 cm/s LVOT Vmean:  63.300 cm/s LVOT VTI:    0.173 m  AORTA Ao Root diam: 3.30 cm Ao Asc diam:  3.30 cm MITRAL VALVE               TRICUSPID VALVE MV Area (PHT): 2.51 cm    TR Peak grad:   14.0 mmHg MV Decel Time: 302 msec    TR Vmax:        187.00 cm/s MV E velocity: 69.70 cm/s MV A velocity: 57.50 cm/s  SHUNTS MV E/A ratio:  1.21        Systemic VTI:  0.17 m                            Systemic Diam: 2.50 cm Carolan Clines Electronically signed by Carolan Clines Signature Date/Time: 06/13/2023/12:46:54 PM    Final    DG Chest 2 View  Result Date: 06/12/2023 CLINICAL DATA:  Chest pain EXAM: CHEST - 2 VIEW COMPARISON:  X-ray 08/05/2020 and CT scans FINDINGS: The heart size and mediastinal contours are within normal limits. Both lungs are clear. No consolidation, pneumothorax or effusion. No edema. The visualized skeletal structures are unremarkable. Overlapping cardiac leads. IMPRESSION: No acute cardiopulmonary disease Electronically Signed   By: Karen Kays M.D.   On: 06/12/2023 16:55    Microbiology: Results for orders  placed or performed during the Chandler encounter of 06/12/23  SARS Coronavirus 2 by RT PCR (Chandler order, performed in [facility] lab) *cepheid single result test* Anterior Nasal Swab     Status: None   Collection Time: 06/12/23  4:32 PM   Specimen: Anterior Nasal Swab  Result Value Ref Range Status   SARS Coronavirus 2 by RT PCR NEGATIVE NEGATIVE Final    Comment: (NOTE) SARS-CoV-2 target nucleic acids are NOT DETECTED.  The SARS-CoV-2 RNA is generally detectable in upper and lower respiratory specimens during the acute phase of infection. The lowest concentration of SARS-CoV-2 viral copies this assay can detect is 250 copies / mL. A negative result does not preclude SARS-CoV-2 infection and should not be used as the sole basis for treatment or other patient management decisions.  A negative result may occur with improper specimen collection / handling, submission of specimen other than nasopharyngeal swab, presence of viral mutation(s) within the areas targeted by this assay, and inadequate number of viral copies (<250 copies / mL). A negative result must be combined with clinical observations, patient history, and epidemiological information.  Fact Sheet for Patients:   RoadLapTop.co.za  Fact Sheet for Healthcare Providers: http://kim-miller.com/  This test is not yet approved or  cleared by the [place] FDA and has been authorized for detection and/or diagnosis of SARS-CoV-2 by FDA under an Emergency Use Authorization (EUA).  This EUA will remain in effect (meaning this test can be used) for the duration of the COVID-19 declaration under Section 564(b)(1) of the Act, 21 U.S.C.  section 360bbb-3(b)(1), unless the authorization is terminated or revoked sooner.  Performed at [facility], [address] [place], [place] [address]    Labs: CBC: Recent Labs  Lab 06/12/23 1632  WBC 7.5  HGB  15.4  HCT 44.7  MCV 88.2  PLT 224   Basic Metabolic Panel: Recent Labs  Lab 06/12/23 1632  NA 135  K 4.2  CL 95*  CO2 28  GLUCOSE 103*  BUN 12  CREATININE 0.90  CALCIUM 10.6*   Liver Function Tests: No results for input(s): "AST", "ALT", "ALKPHOS", "BILITOT", "PROT", "ALBUMIN" in the last 168 hours. CBG: No results for input(s): "GLUCAP" in the last 168 hours.  Discharge time spent: greater than 30 minutes.  Author: Lynden Oxford, MD  [facility] 06/13/2023

## 2023-06-22 DIAGNOSIS — R569 Unspecified convulsions: Secondary | ICD-10-CM | POA: Diagnosis not present

## 2023-06-22 DIAGNOSIS — I300 Acute nonspecific idiopathic pericarditis: Secondary | ICD-10-CM | POA: Diagnosis not present

## 2023-06-22 DIAGNOSIS — R079 Chest pain, unspecified: Secondary | ICD-10-CM | POA: Diagnosis not present

## 2023-06-29 ENCOUNTER — Ambulatory Visit (INDEPENDENT_AMBULATORY_CARE_PROVIDER_SITE_OTHER): Payer: BC Managed Care – PPO | Admitting: Neurology

## 2023-06-29 ENCOUNTER — Encounter: Payer: Self-pay | Admitting: Neurology

## 2023-06-29 VITALS — BP 119/79 | HR 85 | Ht 71.0 in | Wt 191.5 lb

## 2023-06-29 DIAGNOSIS — R569 Unspecified convulsions: Secondary | ICD-10-CM | POA: Diagnosis not present

## 2023-06-29 NOTE — Progress Notes (Signed)
[facility]  PATIENT: Cody Chandler DOB: 2000/12/02  REQUESTING CLINICIAN: Carin Hock, PA HISTORY FROM: Patient/Girlfriend over the phone REASON FOR VISIT: Seizure like activity    HISTORICAL  CHIEF COMPLAINT:  Chief Complaint  Patient presents with   New Patient (Initial Visit)    Rm 12. Alone. Received Internal referral from [facility] and urgent paper referral for unspecified convulsions.    HISTORY OF PRESENT ILLNESS:  This is a 22 year old gentleman with no reported past medical history who is presenting after a seizure like activity.  Patient reported mid of July he had a seizure-like activity.  He does not remember much of the event but girlfriend was able to provide history over the phone.  She said that she was with patient, initially he was unresponsive, eyes was closed.  He did not respond to his name or touch.  She put cold water on him and still unresponsive.  She tried to help him stand up and this is where he started having convulsion and fell to the ground.  She was able to help him to the floor, did not hit his head, his eyes were rolled back.  EMS was called.  Girlfriend reports patient was having convulsion on the floor.  When EMS arrived he did have a second seizure. There were no tongue biting or urinary incontinence. He refused to go to the hospital. He felt the event was related to dehydration as he was not drinking a lot of fluid on a very hot day.  He denies any previous history of seizures, no family history of seizures but did report concussion in the past.  On June 27 he was admitted to hospital for chest pain.  No other past medical history reported.   Handedness: Right handed   Onset: July 2024  Seizure Type: Unresponsiveness, followed by convulsion   Current frequency: Only once   Any injuries from seizures: Denies   Seizure risk factors: Concussion   Previous ASMs: None   Currenty ASMs: None   ASMs side effects: N/A    Brain Images: Normal head CT   Previous EEGs: None    OTHER MEDICAL CONDITIONS: Chest pain  REVIEW OF SYSTEMS: Full 14 system review of systems performed and negative with exception of: As noted in the HPI   ALLERGIES: No Known Allergies  HOME MEDICATIONS: Outpatient Medications Prior to Visit  Medication Sig Dispense Refill   colchicine 0.6 MG tablet Take 1 tablet (0.6 mg total) by mouth 2 (two) times daily. 60 tablet 2   ibuprofen (ADVIL) 600 MG tablet Take 1 tablet (600 mg total) by mouth with breakfast, with lunch, and with evening meal. (Patient taking differently: Take 600 mg by mouth 2 (two) times daily.) 30 tablet 0   pantoprazole (PROTONIX) 40 MG tablet Take 1 tablet (40 mg total) by mouth daily at 6 (six) AM. 14 tablet 0   No facility-administered medications prior to visit.    PAST MEDICAL HISTORY: Past Medical History:  Diagnosis Date   ADHD (attention deficit hyperactivity disorder)     PAST SURGICAL HISTORY: History reviewed. No pertinent surgical history.  FAMILY HISTORY: History reviewed. No pertinent family history.  SOCIAL HISTORY: Social History   Socioeconomic History   Marital status: Single    Spouse name: Not on file   Number of children: Not on file   Years of education: Not on file   Highest education level: Not on file  Occupational History   Not on file  Tobacco Use  Smoking status: Never   Smokeless tobacco: Never  Substance and Sexual Activity   Alcohol use: No   Drug use: No   Sexual activity: Never  Other Topics Concern   Not on file  Social History Narrative   Not on file   Social Determinants of Health   Financial Resource Strain: Not on file  Food Insecurity: No Food Insecurity (06/13/2023)   Hunger Vital Sign    Worried About Running Out of Food in the Last Year: Never true    Ran Out of Food in the Last Year: Never true  Transportation Needs: No Transportation Needs (06/13/2023)   PRAPARE - Doctor, general practice (Medical): No    Lack of Transportation (Non-Medical): No  Physical Activity: Not on file  Stress: Not on file  Social Connections: Unknown (01/07/2023)   Received from [facility]   Social Network    Social Network: Not on file  Intimate Partner Violence: Not At Risk (06/13/2023)   Humiliation, Afraid, Rape, and Kick questionnaire    Fear of Current or Ex-Partner: No    Emotionally Abused: No    Physically Abused: No    Sexually Abused: No     PHYSICAL EXAM  GENERAL EXAM/CONSTITUTIONAL: Vitals:  Vitals:   06/29/23 0932  BP: 119/79  Pulse: 85  Weight: 191 lb 8 oz (86.9 kg)  Height: 5\' 11"  (1.803 m)   Body mass index is 26.71 kg/m. Wt Readings from Last 3 Encounters:  06/29/23 191 lb 8 oz (86.9 kg)  06/13/23 189 lb 3.2 oz (85.8 kg)  06/23/22 175 lb 0.7 oz (79.4 kg)   Patient is in no distress; well developed, nourished and groomed; neck is supple  MUSCULOSKELETAL: Gait, strength, tone, movements noted in Neurologic exam below  NEUROLOGIC: MENTAL STATUS:      No data to display         awake, alert, oriented to person, place and time recent and remote memory intact normal attention and concentration language fluent, comprehension intact, naming intact fund of knowledge appropriate  CRANIAL NERVE:  2nd, 3rd, 4th, 6th - Visual fields full to confrontation, extraocular muscles intact, no nystagmus 5th - facial sensation symmetric 7th - facial strength symmetric 8th - hearing intact 9th - palate elevates symmetrically, uvula midline 11th - shoulder shrug symmetric 12th - tongue protrusion midline  MOTOR:  normal bulk and tone, full strength in the BUE, BLE  SENSORY:  normal and symmetric to light touch  COORDINATION:  finger-nose-finger, fine finger movements normal  GAIT/STATION:  normal   DIAGNOSTIC DATA (LABS, IMAGING, TESTING) - I reviewed patient records, labs, notes, testing and imaging myself where available.  Lab  Results  Component Value Date   WBC 7.5 06/12/2023   HGB 15.4 06/12/2023   HCT 44.7 06/12/2023   MCV 88.2 06/12/2023   PLT 224 06/12/2023      Component Value Date/Time   NA 135 06/12/2023 1632   K 4.2 06/12/2023 1632   CL 95 (L) 06/12/2023 1632   CO2 28 06/12/2023 1632   GLUCOSE 103 (H) 06/12/2023 1632   BUN 12 06/12/2023 1632   CREATININE 0.90 06/12/2023 1632   CALCIUM 10.6 (H) 06/12/2023 1632   PROT 7.7 06/23/2022 1256   ALBUMIN 4.7 06/23/2022 1256   AST 17 06/23/2022 1256   ALT 12 06/23/2022 1256   ALKPHOS 43 06/23/2022 1256   BILITOT 1.3 (H) 06/23/2022 1256   GFRNONAA >60 06/12/2023 1632   GFRAA >60 08/05/2020 2347  No results found for: "CHOL", "HDL", "LDLCALC", "LDLDIRECT", "TRIG" No results found for: "HGBA1C" No results found for: "VITAMINB12" No results found for: "TSH"  Head CT 04/16/2022 No acute intracranial abnormality.   I personally reviewed brain Images and previous EEG reports.   ASSESSMENT AND PLAN  22 y.o. year old male  with no past medical history who is presenting after seizure-like activity.  Seizure described as unresponsiveness followed by generalized convulsion, there were no tongue biting, no urinary incontinence.  Plan is to obtain routine EEG and if abnormal, will obtain MRI and discuss possibility of starting antiseizure medication.  This was discussed with patient and he is comfortable with plan.  Advised him to contact me or call 911 if he has a second seizure.  He voiced understanding.  Continue to follow with PCP return sooner if worse.   1. Seizure-like activity (HCC)     Patient Instructions  Routine EEG, I will contact you to go over the results  Driving restriction for the next 6 months  Follow up with PCP  Return as needed    Per [facility] statutes, patients with seizures are not allowed to drive until they have been seizure-free for six months.  Other recommendations include using caution when using heavy equipment  or power tools. Avoid working on ladders or at heights. Take showers instead of baths.  Do not swim alone.  Ensure the water temperature is not too high on the home water heater. Do not go swimming alone. Do not lock yourself in a room alone (i.e. bathroom). When caring for infants or small children, sit down when holding, feeding, or changing them to minimize risk of injury to the child in the event you have a seizure. Maintain good sleep hygiene. Avoid alcohol.  Also recommend adequate sleep, hydration, good diet and minimize stress.   During the Seizure  - First, ensure adequate ventilation and place patients on the floor on their left side  Loosen clothing around the neck and ensure the airway is patent. If the patient is clenching the teeth, do not force the mouth open with any object as this can cause severe damage - Remove all items from the surrounding that can be hazardous. The patient may be oblivious to what's happening and may not even know what he or she is doing. If the patient is confused and wandering, either gently guide him/her away and block access to outside areas - Reassure the individual and be comforting - Call 911. In most cases, the seizure ends before EMS arrives. However, there are cases when seizures may last over 3 to 5 minutes. Or the individual may have developed breathing difficulties or severe injuries. If a pregnant patient or a person with diabetes develops a seizure, it is prudent to call an ambulance. - Finally, if the patient does not regain full consciousness, then call EMS. Most patients will remain confused for about 45 to 90 minutes after a seizure, so you must use judgment in calling for help. - Avoid restraints but make sure the patient is in a bed with padded side rails - Place the individual in a lateral position with the neck slightly flexed; this will help the saliva drain from the mouth and prevent the tongue from falling backward - Remove all nearby  furniture and other hazards from the area - Provide verbal assurance as the individual is regaining consciousness - Provide the patient with privacy if possible - Call for help and start treatment as  ordered by the caregiver   After the Seizure (Postictal Stage)  After a seizure, most patients experience confusion, fatigue, muscle pain and/or a headache. Thus, one should permit the individual to sleep. For the next few days, reassurance is essential. Being calm and helping reorient the person is also of importance.  Most seizures are painless and end spontaneously. Seizures are not harmful to others but can lead to complications such as stress on the lungs, brain and the heart. Individuals with prior lung problems may develop labored breathing and respiratory distress.     Orders Placed This Encounter  Procedures   EEG adult    No orders of the defined types were placed in this encounter.   No follow-ups on file.    Windell Norfolk, MD 06/29/2023, 9:55 AM  [facility] [address], [address] [place], [place] [address] [phone]

## 2023-06-29 NOTE — Patient Instructions (Signed)
Routine EEG, I will contact you to go over the results  Driving restriction for the next 6 months  Follow up with PCP  Return as needed

## 2023-07-06 ENCOUNTER — Ambulatory Visit (INDEPENDENT_AMBULATORY_CARE_PROVIDER_SITE_OTHER): Payer: BC Managed Care – PPO | Admitting: Neurology

## 2023-07-06 DIAGNOSIS — I300 Acute nonspecific idiopathic pericarditis: Secondary | ICD-10-CM | POA: Diagnosis not present

## 2023-07-06 DIAGNOSIS — R569 Unspecified convulsions: Secondary | ICD-10-CM

## 2023-07-09 NOTE — Procedures (Signed)
    History:  22 year old man with seizure like activity   EEG classification: Awake and drowsy  Duration: 27 minutes   Technical aspects: This EEG study was done with scalp electrodes positioned according to the 10-20 International system of electrode placement. Electrical activity was reviewed with band pass filter of 1-70Hz , sensitivity of 7 uV/mm, display speed of 75mm/sec with a 60Hz  notched filter applied as appropriate. EEG data were recorded continuously and digitally stored.   Description of the recording: The background rhythms of this recording consists of a fairly well modulated medium amplitude alpha rhythm of 10 Hz that is reactive to eye opening and closure. Present in the anterior head region is a 15-20 Hz beta activity. Photic stimulation was performed, did not show any abnormalities. Hyperventilation was not performed. Drowsiness was manifested by background fragmentation. No abnormal epileptiform discharges seen during this recording. There was no focal slowing. There were no electrographic seizure identified.   Abnormality: None   Impression: This is a normal EEG recorded while drowsy and awake. No evidence of interictal epileptiform discharges. Normal EEGs, however, do not rule out epilepsy.    Windell Norfolk, MD [facility]

## 2023-08-03 DIAGNOSIS — I300 Acute nonspecific idiopathic pericarditis: Secondary | ICD-10-CM | POA: Diagnosis not present

## 2023-08-17 NOTE — Progress Notes (Unsigned)
Cardiology Office Note:    Date:  08/18/2023   ID:  Cody Chandler, DOB Jul 03, 2001, MRN [id number]  PCP:  Carin Hock, PA  Cardiologist:  None  Electrophysiologist:  None   Referring MD: Carin Hock, PA   Chief Complaint  Patient presents with   New Patient (Initial Visit)   pericarditis    History of Present Illness:    Cody Chandler is a 22 y.o. male with a hx of pericarditis, ADHD who is referred by Carin Hock, PA for evaluation pericarditis.  He was admitted 05/2023 with chest pain.  EKG showed diffuse ST elevation and PR depression consistent with pericarditis.  Troponins were mildly elevated, peaking at 89.  Echocardiogram 06/05/2023 showed EF 60 to 65%, normal diastolic function, normal RV function, no significant valvular disease, no pericardial effusion.  He was discharged on ibuprofen 600 mg 3 times daily x 1 week and colchicine 0.6 mg twice daily with plan for 25-month course.  Reports vapes daily.  Both grandfathers had CABG  He reports his chest pain was worse with deep breathing.  It resolved after about a week on ibuprofen and colchicine.  Denies any symptoms currently.  Denies any chest pain, dyspnea, lightheadedness, syncope, lower extremity edema, or palpitations.  Active at work, denies any exertional chest pain.  Denies any diarrhea or gastrointestinal symptoms on colchicine   Past Medical History:  Diagnosis Date   ADHD    ADHD (attention deficit hyperactivity disorder)     History reviewed. No pertinent surgical history.  Current Medications: Current Meds  Medication Sig   colchicine 0.6 MG tablet Take 1 tablet (0.6 mg total) by mouth 2 (two) times daily.   ibuprofen (ADVIL) 600 MG tablet Take 1 tablet (600 mg total) by mouth with breakfast, with lunch, and with evening meal. (Patient taking differently: Take 600 mg by mouth 2 (two) times daily.)     Allergies:   Patient has no known allergies.   Social History   Socioeconomic History   Marital  status: Single    Spouse name: Not on file   Number of children: Not on file   Years of education: Not on file   Highest education level: Not on file  Occupational History   Not on file  Tobacco Use   Smoking status: Never   Smokeless tobacco: Never  Vaping Use   Vaping status: Every Day  Substance and Sexual Activity   Alcohol use: Yes   Drug use: No   Sexual activity: Never  Other Topics Concern   Not on file  Social History Narrative   Not on file   Social Determinants of Health   Financial Resource Strain: Not on file  Food Insecurity: No Food Insecurity (06/13/2023)   Hunger Vital Sign    Worried About Running Out of Food in the Last Year: Never true    Ran Out of Food in the Last Year: Never true  Transportation Needs: No Transportation Needs (06/13/2023)   PRAPARE - Administrator, Civil Service (Medical): No    Lack of Transportation (Non-Medical): No  Physical Activity: Not on file  Stress: Not on file  Social Connections: Unknown (01/07/2023)   Received from [facility]   Social Network    Social Network: Not on file     Family History: The patient's family history is not on file.  ROS:   Please see the history of present illness.     All other systems reviewed and are negative.  EKGs/Labs/Other Studies Reviewed:    The following studies were reviewed today:   EKG:   08/18/2023: Normal sinus rhythm, rate 63, early repolarization  Recent Labs: 06/12/2023: BUN 12; Creatinine, Ser 0.90; Hemoglobin 15.4; Platelets 224; Potassium 4.2; Sodium 135  Recent Lipid Panel No results found for: "CHOL", "TRIG", "HDL", "CHOLHDL", "VLDL", "LDLCALC", "LDLDIRECT"  Physical Exam:    VS:  BP (!) 118/92 (BP Location: Right Arm, Patient Position: Sitting, Cuff Size: Normal)   Pulse 63   Ht 5\' 10"  (1.778 m)   Wt 190 lb (86.2 kg)   BMI 27.26 kg/m     Wt Readings from Last 3 Encounters:  08/18/23 190 lb (86.2 kg)  06/29/23 191 lb 8 oz (86.9 kg)   06/13/23 189 lb 3.2 oz (85.8 kg)     GEN:  Well nourished, well developed in no acute distress HEENT: Normal NECK: No JVD; No carotid bruits LYMPHATICS: No lymphadenopathy CARDIAC: RRR, no murmurs, rubs, gallops RESPIRATORY:  Clear to auscultation without rales, wheezing or rhonchi  ABDOMEN: Soft, non-tender, non-distended MUSCULOSKELETAL:  No edema; No deformity  SKIN: Warm and dry NEUROLOGIC:  Alert and oriented x 3 PSYCHIATRIC:  Normal affect   ASSESSMENT:    1. Acute idiopathic pericarditis   2. Engages in vaping    PLAN:    Acute pericarditis: He was admitted 05/2023 with chest pain.  EKG showed diffuse ST elevation and PR depression consistent with pericarditis.  Troponins were mildly elevated, peaking at 89.  Echocardiogram 06/05/2023 showed EF 60 to 65%, normal diastolic function, normal RV function, no significant valvular disease, no pericardial effusion.  He was discharged on ibuprofen taper and colchicine 0.6 mg twice daily with plan for 74-month course.  Currently chest pain-free -Check ESR/CRP -Continue colchicine 0.6 mg twice daily to complete 73-month course (thru end of October 2024)  Vaping: cessation recommended  RTC in 6 months   Medication Adjustments/Labs and Tests Ordered: Current medicines are reviewed at length with the patient today.  Concerns regarding medicines are outlined above.  Orders Placed This Encounter  Procedures   Sed Rate (ESR)   C-reactive protein   EKG 12-Lead   No orders of the defined types were placed in this encounter.   Patient Instructions  Medication Instructions:  Continue Colchicine May stop taking 10/31 Continue all other medications *If you need a refill on your cardiac medications before your next appointment, please call your pharmacy*   Lab Work: Sed rate,crp today   Testing/Procedures: None ordered   Follow-Up: At [facility], you and your health needs are our priority.  As part of our continuing  mission to provide you with exceptional heart care, we have created designated Provider Care Teams.  These Care Teams include your primary Cardiologist (physician) and Advanced Practice Providers (APPs -  Physician Assistants and Nurse Practitioners) who all work together to provide you with the care you need, when you need it.  We recommend signing up for the patient portal called "MyChart".  Sign up information is provided on this After Visit Summary.  MyChart is used to connect with patients for Virtual Visits (Telemedicine).  Patients are able to view lab/test results, encounter notes, upcoming appointments, etc.  Non-urgent messages can be sent to your provider as well.   To learn more about what you can do with MyChart, go to [email or web address].    Your next appointment:  6 months    Provider:  Dr.Kyerra Vargo     Signed, Little Ishikawa, MD  08/18/2023  1:52 PM    [facility]

## 2023-08-18 ENCOUNTER — Ambulatory Visit: Payer: BC Managed Care – PPO | Attending: Cardiology | Admitting: Cardiology

## 2023-08-18 ENCOUNTER — Encounter: Payer: Self-pay | Admitting: Cardiology

## 2023-08-18 VITALS — BP 118/92 | HR 63 | Ht 70.0 in | Wt 190.0 lb

## 2023-08-18 DIAGNOSIS — Z7289 Other problems related to lifestyle: Secondary | ICD-10-CM

## 2023-08-18 DIAGNOSIS — I300 Acute nonspecific idiopathic pericarditis: Secondary | ICD-10-CM | POA: Diagnosis not present

## 2023-08-18 NOTE — Patient Instructions (Signed)
Medication Instructions:  Continue Colchicine May stop taking 10/31 Continue all other medications *If you need a refill on your cardiac medications before your next appointment, please call your pharmacy*   Lab Work: Sed rate,crp today   Testing/Procedures: None ordered   Follow-Up: At [facility], you and your health needs are our priority.  As part of our continuing mission to provide you with exceptional heart care, we have created designated Provider Care Teams.  These Care Teams include your primary Cardiologist (physician) and Advanced Practice Providers (APPs -  Physician Assistants and Nurse Practitioners) who all work together to provide you with the care you need, when you need it.  We recommend signing up for the patient portal called "MyChart".  Sign up information is provided on this After Visit Summary.  MyChart is used to connect with patients for Virtual Visits (Telemedicine).  Patients are able to view lab/test results, encounter notes, upcoming appointments, etc.  Non-urgent messages can be sent to your provider as well.   To learn more about what you can do with MyChart, go to [email or web address].    Your next appointment:  6 months    Provider:  Dr.Schumann

## 2023-08-19 LAB — SEDIMENTATION RATE: Sed Rate: 4 mm/h (ref 0–15)

## 2023-08-19 LAB — C-REACTIVE PROTEIN: CRP: <1 mg/L (ref 0–10)

## 2023-09-02 ENCOUNTER — Encounter: Payer: Self-pay | Admitting: *Deleted

## 2023-09-02 ENCOUNTER — Telehealth: Payer: Self-pay | Admitting: Cardiology

## 2023-09-02 NOTE — Telephone Encounter (Signed)
Patient is returning call to discuss lab results. 

## 2023-09-02 NOTE — Telephone Encounter (Signed)
Spoke with patient and results relayed

## 2023-09-15 IMAGING — CT CT HEAD W/O CM
4 series · 16 of 47 positions shown, 18 images · non-contrast
Comparison: None Available.

CLINICAL DATA: A 21-year-old male presents with history of head
trauma.



[Series 2: head bone · axial · 0.48mm/px · z∈[-56,-24]mm · 3 of 82 slices shown]
[im 9/82  bone]
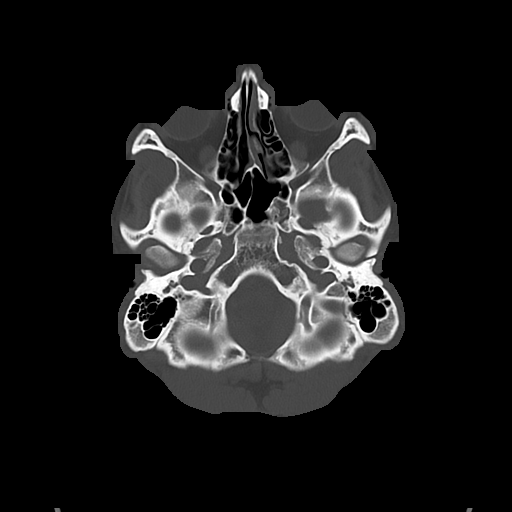
[im 17/82  bone]
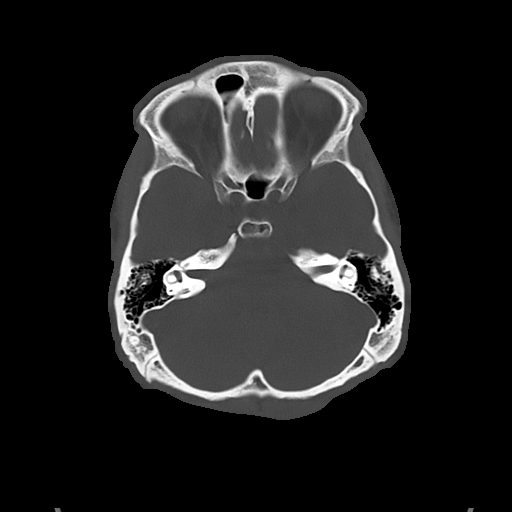
[im 25/82  bone]
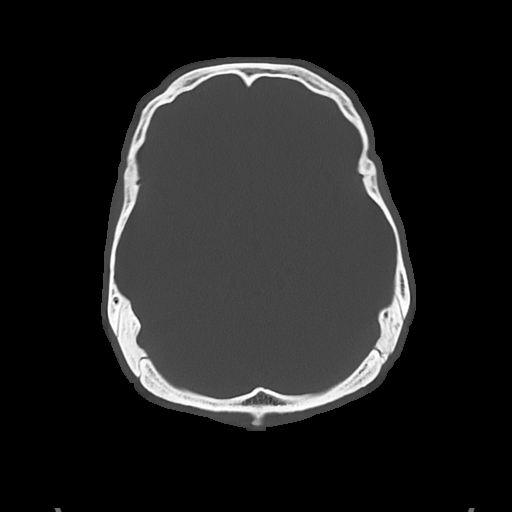

[Series 3: head wo · axial · 0.48mm/px · z∈[-52,+68]mm · 7 of 33 slices shown, 9 images]
[im 5/33  brain]
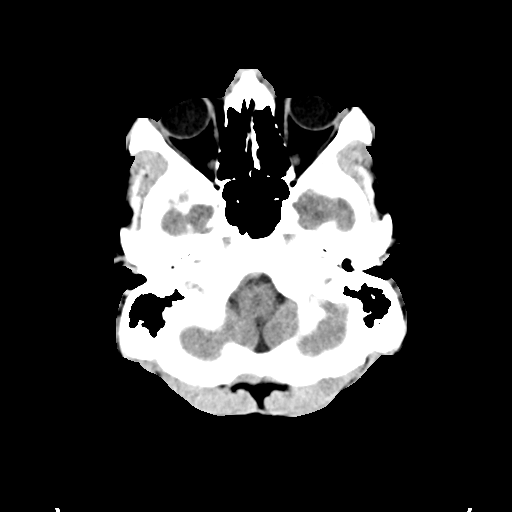
[im 5/33  bone]
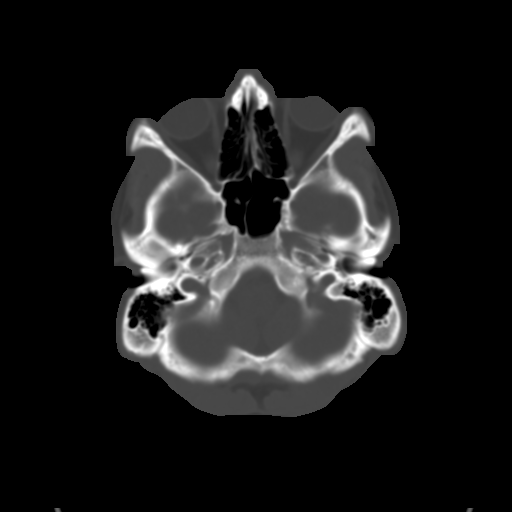
[im 9/33  brain]
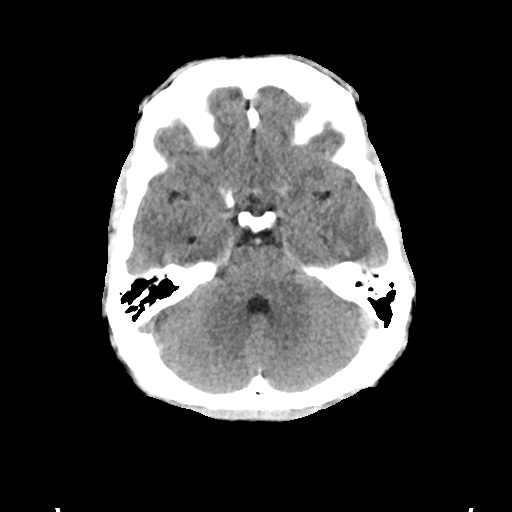
[im 13/33  brain]
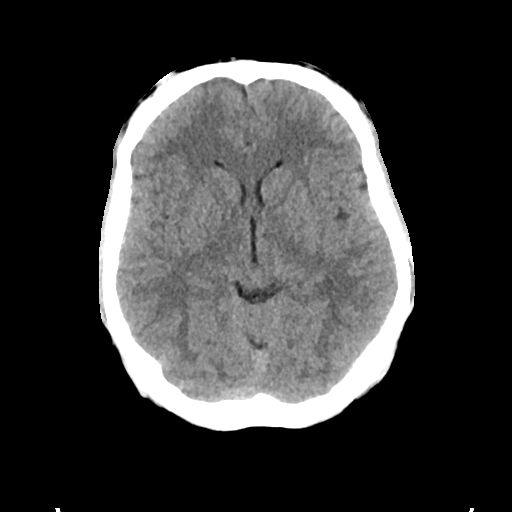
[im 17/33  brain]
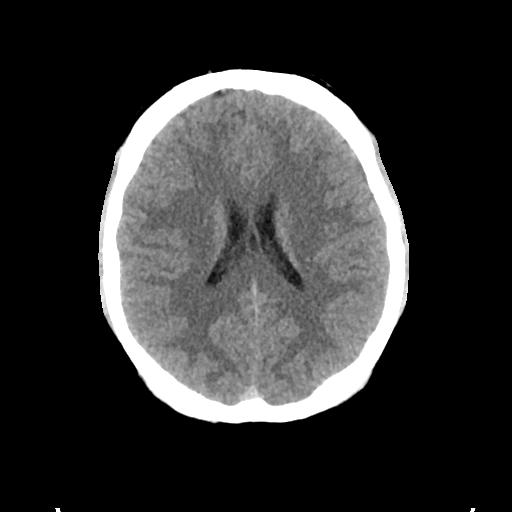
[im 21/33  brain]
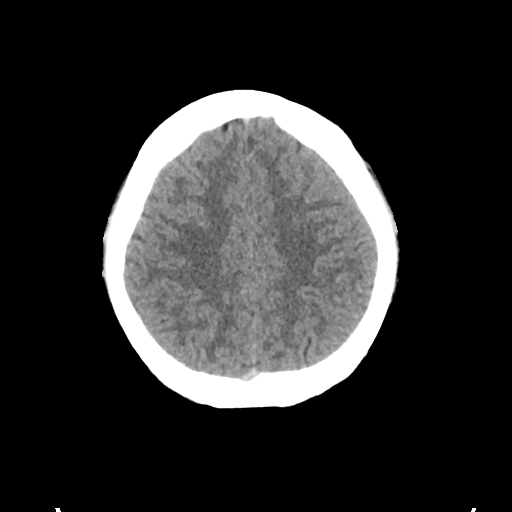
[im 21/33  bone]
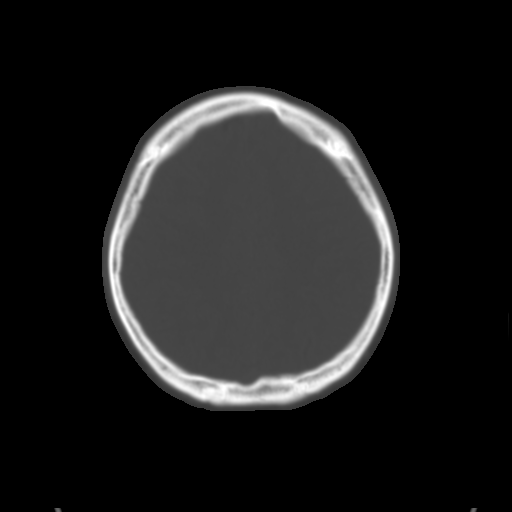
[im 25/33  brain]
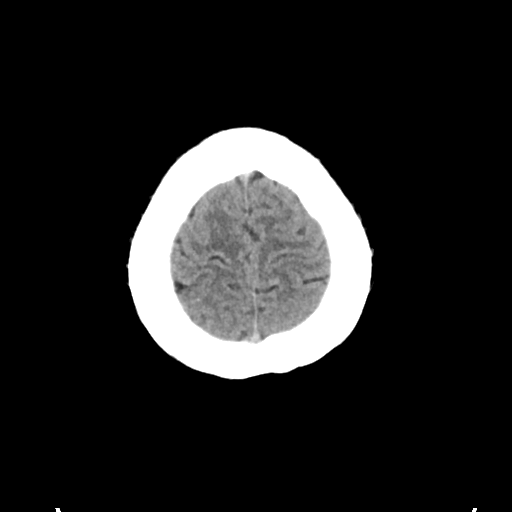
[im 29/33  brain]
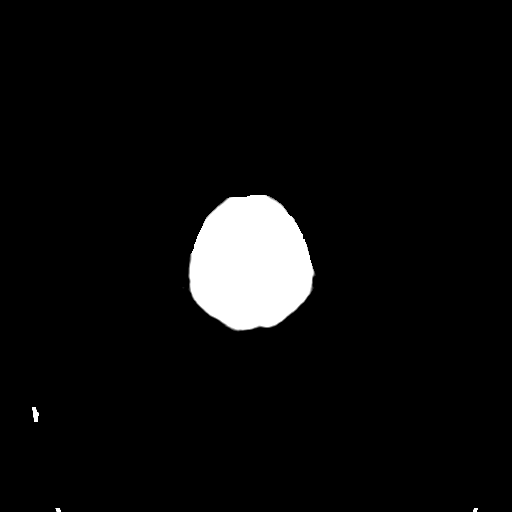

[Series 4: cor soft · coronal · 0.31mm/px · 3 of 67 slices shown]
[im 23/67  brain]
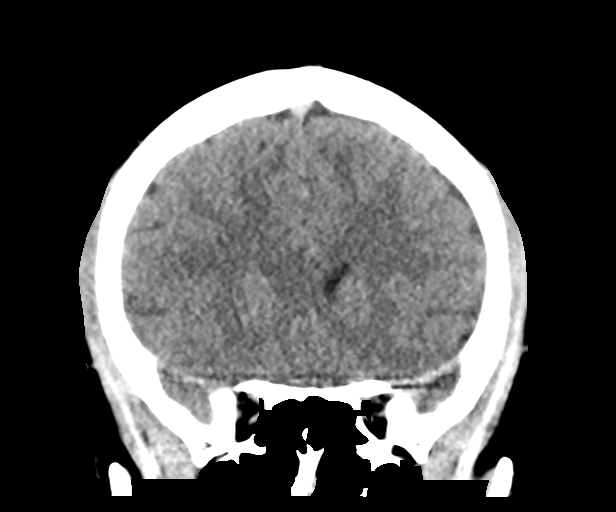
[im 30/67  brain]
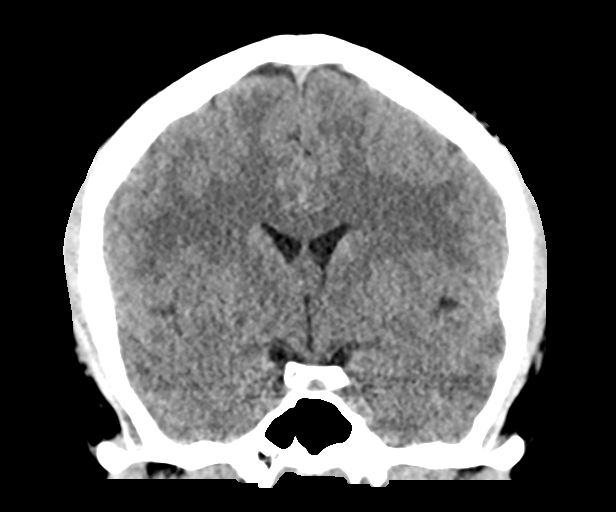
[im 37/67  brain]
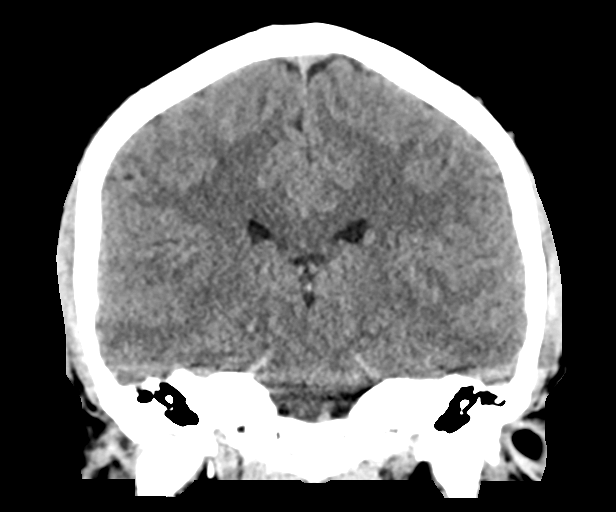

[Series 5: sag soft · sagittal · 0.31mm/px · 3 of 64 slices shown]
[im 22/64  brain]
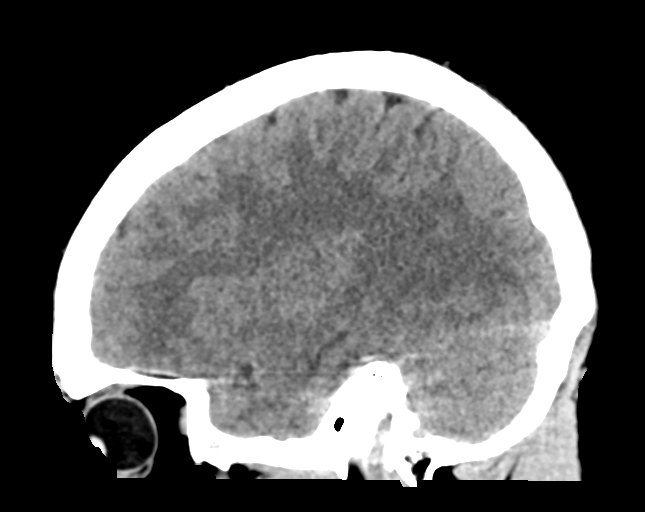
[im 32/64  brain]
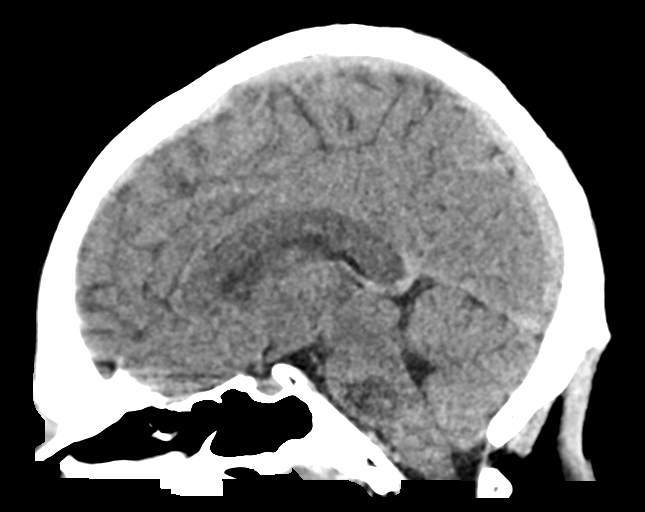
[im 43/64  brain]
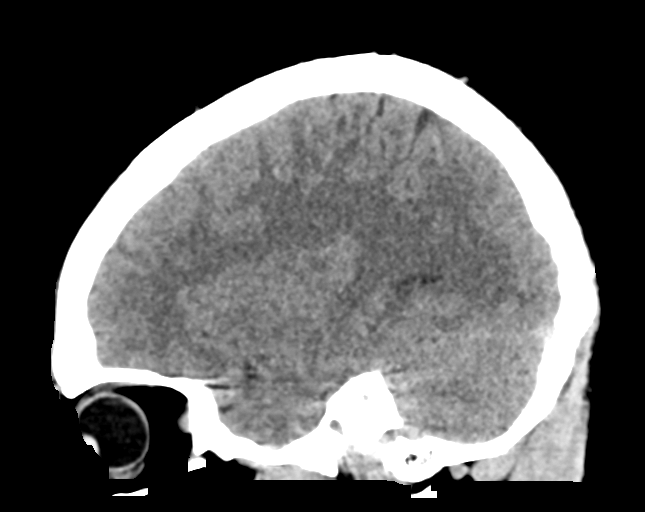

[16 of 47 positions shown; findings below may reference images not displayed]

FINDINGS: Brain: No evidence of acute infarction, hemorrhage, hydrocephalus,
extra-axial collection or mass lesion/mass effect.

Vascular: No hyperdense vessel or unexpected calcification.

Skull: Normal. Negative for fracture or focal lesion.

Sinuses/Orbits: Visualized paranasal sinuses and orbits are
unremarkable.

Other: None
IMPRESSION: No acute intracranial abnormality.

## 2023-10-11 DIAGNOSIS — Z13220 Encounter for screening for lipoid disorders: Secondary | ICD-10-CM | POA: Diagnosis not present

## 2023-10-11 DIAGNOSIS — Z0000 Encounter for general adult medical examination without abnormal findings: Secondary | ICD-10-CM | POA: Diagnosis not present

## 2024-02-02 NOTE — Progress Notes (Deleted)
 Cardiology Office Note:    Date:  02/02/2024   ID:  Cody Chandler Born, DOB 05-Apr-2001, MRN [id number]  PCP:  Carin Hock, PA  Cardiologist:  None  Electrophysiologist:  None   Referring MD: Carin Hock, PA   No chief complaint on file.   History of Present Illness:    Cody Chandler is a 23 y.o. male with a hx of pericarditis, ADHD who presents for follow-up.  He was referred by Carin Hock, PA for evaluation of pericarditis, initially seen 10-24.  He was admitted 05/2023 with chest pain.  EKG showed diffuse ST elevation and PR depression consistent with pericarditis.  Troponins were mildly elevated, peaking at 89.  Echocardiogram 06/05/2023 showed EF 60 to 65%, normal diastolic function, normal RV function, no significant valvular disease, no pericardial effusion.  He was discharged on ibuprofen 600 mg 3 times daily x 1 week and colchicine 0.6 mg twice daily with plan for 84-month course.  Reports vapes daily.  Both grandfathers had CABG.  Since last clinic visit,  He reports his chest pain was worse with deep breathing.  It resolved after about a week on ibuprofen and colchicine.  Denies any symptoms currently.  Denies any chest pain, dyspnea, lightheadedness, syncope, lower extremity edema, or palpitations.  Active at work, denies any exertional chest pain.  Denies any diarrhea or gastrointestinal symptoms on colchicine   Past Medical History:  Diagnosis Date   ADHD    ADHD (attention deficit hyperactivity disorder)     No past surgical history on file.  Current Medications: No outpatient medications have been marked as taking for the 02/03/24 encounter (Appointment) with Little Ishikawa, MD.     Allergies:   Patient has no known allergies.   Social History   Socioeconomic History   Marital status: Single    Spouse name: Not on file   Number of children: Not on file   Years of education: Not on file   Highest education level: Not on file  Occupational History    Not on file  Tobacco Use   Smoking status: Never   Smokeless tobacco: Never  Vaping Use   Vaping status: Every Day  Substance and Sexual Activity   Alcohol use: Yes   Drug use: No   Sexual activity: Never  Other Topics Concern   Not on file  Social History Narrative   Not on file   Social Drivers of Health   Financial Resource Strain: Not on file  Food Insecurity: No Food Insecurity (06/13/2023)   Hunger Vital Sign    Worried About Running Out of Food in the Last Year: Never true    Ran Out of Food in the Last Year: Never true  Transportation Needs: No Transportation Needs (06/13/2023)   PRAPARE - Administrator, Civil Service (Medical): No    Lack of Transportation (Non-Medical): No  Physical Activity: Not on file  Stress: Not on file  Social Connections: Unknown (01/07/2023)   Received from [facility]   Social Network    Social Network: Not on file     Family History: The patient's family history is not on file.  ROS:   Please see the history of present illness.     All other systems reviewed and are negative.  EKGs/Labs/Other Studies Reviewed:    The following studies were reviewed today:   EKG:   08/18/2023: Normal sinus rhythm, rate 63, early repolarization  Recent Labs: 06/12/2023: BUN 12; Creatinine, Ser 0.90; Hemoglobin 15.4; Platelets  224; Potassium 4.2; Sodium 135  Recent Lipid Panel No results found for: "CHOL", "TRIG", "HDL", "CHOLHDL", "VLDL", "LDLCALC", "LDLDIRECT"  Physical Exam:    VS:  There were no vitals taken for this visit.    Wt Readings from Last 3 Encounters:  08/18/23 190 lb (86.2 kg)  06/29/23 191 lb 8 oz (86.9 kg)  06/13/23 189 lb 3.2 oz (85.8 kg)     GEN:  Well nourished, well developed in no acute distress HEENT: Normal NECK: No JVD; No carotid bruits LYMPHATICS: No lymphadenopathy CARDIAC: RRR, no murmurs, rubs, gallops RESPIRATORY:  Clear to auscultation without rales, wheezing or rhonchi  ABDOMEN: Soft,  non-tender, non-distended MUSCULOSKELETAL:  No edema; No deformity  SKIN: Warm and dry NEUROLOGIC:  Alert and oriented x 3 PSYCHIATRIC:  Normal affect   ASSESSMENT:    No diagnosis found.  PLAN:    Acute pericarditis: He was admitted 05/2023 with chest pain.  EKG showed diffuse ST elevation and PR depression consistent with pericarditis.  Troponins were mildly elevated, peaking at 89.  Echocardiogram 06/05/2023 showed EF 60 to 65%, normal diastolic function, normal RV function, no significant valvular disease, no pericardial effusion.  He was discharged on ibuprofen taper and colchicine 0.6 mg twice daily with plan for 56-month course.  Currently chest pain-free.  ESR/CRP normal at follow-up appointment 08/2023 -Continue colchicine 0.6 mg twice daily to complete 78-month course (thru end of October 2024)  Vaping: cessation recommended  RTC in 6 months   Medication Adjustments/Labs and Tests Ordered: Current medicines are reviewed at length with the patient today.  Concerns regarding medicines are outlined above.  No orders of the defined types were placed in this encounter.  No orders of the defined types were placed in this encounter.   There are no Patient Instructions on file for this visit.   Signed, Little Ishikawa, MD  02/02/2024 7:57 PM    [facility]

## 2024-02-03 ENCOUNTER — Ambulatory Visit: Payer: BC Managed Care – PPO | Admitting: Cardiology

## 2024-04-17 DIAGNOSIS — Z8679 Personal history of other diseases of the circulatory system: Secondary | ICD-10-CM | POA: Diagnosis not present

## 2024-04-17 DIAGNOSIS — I300 Acute nonspecific idiopathic pericarditis: Secondary | ICD-10-CM | POA: Diagnosis not present

## 2024-04-17 DIAGNOSIS — Z6827 Body mass index (BMI) 27.0-27.9, adult: Secondary | ICD-10-CM | POA: Diagnosis not present

## 2024-04-17 DIAGNOSIS — R1013 Epigastric pain: Secondary | ICD-10-CM | POA: Diagnosis not present

## 2024-06-06 ENCOUNTER — Ambulatory Visit: Attending: Cardiology | Admitting: Nurse Practitioner

## 2024-06-06 VITALS — BP 118/76 | HR 69 | Ht 70.0 in | Wt 193.0 lb

## 2024-06-06 DIAGNOSIS — Z8679 Personal history of other diseases of the circulatory system: Secondary | ICD-10-CM

## 2024-06-06 DIAGNOSIS — Z79899 Other long term (current) drug therapy: Secondary | ICD-10-CM

## 2024-06-06 NOTE — Progress Notes (Unsigned)
 Office Visit    Patient Name: Cody Chandler Date of Encounter: 06/06/2024  Primary Care Provider:  Sheldon Netter, [place] Primary Cardiologist:  Lonni LITTIE Nanas, MD  Chief Complaint   23 year old male with a history of pericarditis and ADHD who presents for follow-up related to pericarditis.  Past Medical History    Past Medical History:  Diagnosis Date   ADHD    ADHD (attention deficit hyperactivity disorder)    No past surgical history on file.  Allergies  No Known Allergies   Labs/Other Studies Reviewed    The following studies were reviewed today:  Cardiac Studies & Procedures   ______________________________________________________________________________________________     ECHOCARDIOGRAM  ECHOCARDIOGRAM COMPLETE 06/13/2023  Narrative ECHOCARDIOGRAM REPORT    Patient Name:   [facility] Date of Exam: 06/13/2023 Medical Rec #:  [id number]    Height:       70.0 in Accession #:    [id number]   Weight:       189.2 lb Date of Birth:  2001-07-31    BSA:          2.039 m Patient Age:    22 years     BP:           131/87 mmHg Patient Gender: M            HR:           65 bpm. Exam Location:  Inpatient  Procedure: 2D Echo, Color Doppler and Cardiac Doppler  Indications:    R07.9* Chest pain, unspecified; I31.3 Pericardial effusion  History:        Patient has no prior history of Echocardiogram examinations.  Sonographer:    Damien Senior RDCS Referring Phys: [id number] GRAYCE BOLD  IMPRESSIONS   1. Left ventricular ejection fraction, by estimation, is 60 to 65%. The left ventricle has normal function. The left ventricle has no regional wall motion abnormalities. Left ventricular diastolic parameters were normal. 2. Right ventricular systolic function is normal. The right ventricular size is normal. There is normal pulmonary artery systolic pressure. 3. The mitral valve is normal in structure. No evidence of mitral valve regurgitation. 4. The aortic  valve is tricuspid. Aortic valve regurgitation is not visualized. 5. The inferior vena cava is normal in size with greater than 50% respiratory variability, suggesting right atrial pressure of 3 mmHg.  FINDINGS Left Ventricle: Left ventricular ejection fraction, by estimation, is 60 to 65%. The left ventricle has normal function. The left ventricle has no regional wall motion abnormalities. The left ventricular internal cavity size was normal in size. There is no left ventricular hypertrophy. Left ventricular diastolic parameters were normal.  Right Ventricle: The right ventricular size is normal. Right ventricular systolic function is normal. There is normal pulmonary artery systolic pressure. The tricuspid regurgitant velocity is 1.87 m/s, and with an assumed right atrial pressure of 3 mmHg, the estimated right ventricular systolic pressure is 17.0 mmHg.  Left Atrium: Left atrial size was normal in size.  Right Atrium: Right atrial size was normal in size.  Pericardium: There is no evidence of pericardial effusion.  Mitral Valve: The mitral valve is normal in structure. No evidence of mitral valve regurgitation.  Tricuspid Valve: The tricuspid valve is normal in structure. Tricuspid valve regurgitation is mild.  Aortic Valve: The aortic valve is tricuspid. Aortic valve regurgitation is not visualized.  Pulmonic Valve: The pulmonic valve was normal in structure. Pulmonic valve regurgitation is trivial.  Aorta: The aortic root and ascending aorta are structurally  normal, with no evidence of dilitation.  Venous: The inferior vena cava is normal in size with greater than 50% respiratory variability, suggesting right atrial pressure of 3 mmHg.  IAS/Shunts: No atrial level shunt detected by color flow Doppler.   LEFT VENTRICLE PLAX 2D LVIDd:         5.30 cm   Diastology LVIDs:         3.50 cm   LV e' medial:    12.40 cm/s LV PW:         0.70 cm   LV E/e' medial:  5.6 LV IVS:         0.70 cm   LV e' lateral:   12.50 cm/s LVOT diam:     2.50 cm   LV E/e' lateral: 5.6 LV SV:         85 LV SV Index:   42 LVOT Area:     4.91 cm   RIGHT VENTRICLE RV S prime:     10.90 cm/s TAPSE (M-mode): 2.1 cm  LEFT ATRIUM           Index        RIGHT ATRIUM           Index LA diam:      2.30 cm 1.13 cm/m   RA Area:     15.80 cm LA Vol (A2C): 23.2 ml 11.38 ml/m  RA Volume:   38.80 ml  19.03 ml/m LA Vol (A4C): 33.0 ml 16.19 ml/m AORTIC VALVE LVOT Vmax:   88.70 cm/s LVOT Vmean:  63.300 cm/s LVOT VTI:    0.173 m  AORTA Ao Root diam: 3.30 cm Ao Asc diam:  3.30 cm  MITRAL VALVE               TRICUSPID VALVE MV Area (PHT): 2.51 cm    TR Peak grad:   14.0 mmHg MV Decel Time: 302 msec    TR Vmax:        187.00 cm/s MV E velocity: 69.70 cm/s MV A velocity: 57.50 cm/s  SHUNTS MV E/A ratio:  1.21        Systemic VTI:  0.17 m Systemic Diam: 2.50 cm  Ronal Ross Electronically signed by Ronal Ross Signature Date/Time: 06/13/2023/12:46:54 PM    Final          ______________________________________________________________________________________________     Recent Labs: 06/12/2023: BUN 12; Creatinine, Ser 0.90; Hemoglobin 15.4; Platelets 224; Potassium 4.2; Sodium 135  Recent Lipid Panel No results found for: CHOL, TRIG, HDL, CHOLHDL, VLDL, LDLCALC, LDLDIRECT  History of Present Illness   23 year old male with the above past medical history including pericarditis and ADHD.  He was hospitalized in 05/2023 in the setting of chest pain.  EKG showed diffuse ST elevation, PR depression, consistent with pericarditis, troponin was mildly elevated.  Echocardiogram showed EF 60 to 65%, normal diastolic function, normal RV function, no significant valvular disease, no pericardial effusion.  He was discharged on ibuprofen , colchicine .  He was last seen in the office on 08/18/2023 and was stable from a cardiac standpoint.   He presents today for follow-up. Since his  last visit he has been stable overall from a cardiac standpoint.  He saw his primary care doctor approximately 2 months ago with symptoms similar to prior episode of pericarditis. He was started on ibuprofen  and colchicine  per PCP, and was advised to take colchicine  x 6 months.  His symptoms have completely resolved.  Overall, he reports feeling well.  Home Medications  Current Outpatient Medications  Medication Sig Dispense Refill   colchicine  0.6 MG tablet Take 1 tablet (0.6 mg total) by mouth 2 (two) times daily. 60 tablet 2   ibuprofen  (ADVIL ) 600 MG tablet Take 1 tablet (600 mg total) by mouth with breakfast, with lunch, and with evening meal. (Patient taking differently: Take 600 mg by mouth 2 (two) times daily.) 30 tablet 0   No current facility-administered medications for this visit.     Review of Systems    He denies chest pain, palpitations, dyspnea, pnd, orthopnea, n, v, dizziness, syncope, edema, weight gain, or early satiety. All other systems reviewed and are otherwise negative except as noted above.   Physical Exam    VS:  BP 118/76 (BP Location: Left Arm)   Pulse 69   Ht 5' 10 (1.778 m)   Wt 193 lb (87.5 kg)   SpO2 99%   BMI 27.69 kg/m  GEN: Well nourished, well developed, in no acute distress. HEENT: normal. Neck: Supple, no JVD, carotid bruits, or masses. Cardiac: RRR, no murmurs, rubs, or gallops. No clubbing, cyanosis, edema.  Radials/DP/PT 2+ and equal bilaterally.  Respiratory:  Respirations regular and unlabored, clear to auscultation bilaterally. GI: Soft, nontender, nondistended, BS + x 4. MS: no deformity or atrophy. Skin: warm and dry, no rash. Neuro:  Strength and sensation are intact. Psych: Normal affect.  Accessory Clinical Findings    ECG personally reviewed by me today - EKG Interpretation Date/Time:  Tuesday June 06 2024 14:33:26 EDT Ventricular Rate:  69 PR Interval:  146 QRS Duration:  96 QT Interval:  360 QTC Calculation: 385 R  Axis:   90  Text Interpretation: Normal sinus rhythm Rightward axis Early repolarization When compared with ECG of 18-Aug-2023 09:48, No significant change was found Confirmed by Daneen Perkins ([id number]) on 06/06/2024 2:36:58 PM  - no acute changes.   Lab Results  Component Value Date   WBC 7.5 06/12/2023   HGB 15.4 06/12/2023   HCT 44.7 06/12/2023   MCV 88.2 06/12/2023   PLT 224 06/12/2023   Lab Results  Component Value Date   CREATININE 0.90 06/12/2023   BUN 12 06/12/2023   NA 135 06/12/2023   K 4.2 06/12/2023   CL 95 (L) 06/12/2023   CO2 28 06/12/2023   Lab Results  Component Value Date   ALT 12 06/23/2022   AST 17 06/23/2022   ALKPHOS 43 06/23/2022   BILITOT 1.3 (H) 06/23/2022   No results found for: CHOL, HDL, LDLCALC, LDLDIRECT, TRIG, CHOLHDL  No results found for: HGBA1C  Assessment & Plan    1. Pericarditis: Echocardiogram showed EF 60 to 65%, normal diastolic function, normal RV function, no significant valvular disease, no pericardial effusion. Treated with ibuprofen , colchicine . 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  His PCP started him on ibuprofen  and colchicine . He was advised to colchicine  x 6 months.  His symptoms have completely resolved.  Will update ESR, CRP.  Will repeat echocardiogram.  Reviewed ED precautions.  Should he have recurrent symptoms, consider candidacy for Acralyst.  For now, continue colchicine .  2. Disposition: Follow-up in 6 months, sooner if needed.      Perkins JAYSON Daneen, NP 06/06/2024, 2:38 PM

## 2024-06-06 NOTE — Patient Instructions (Signed)
 Lab Work: TODAY ESR, CRP If you have labs (blood work) drawn today and your tests are completely normal, you will receive your results only by: MyChart Message (if you have MyChart) OR A paper copy in the mail If you have any lab test that is abnormal or we need to change your treatment, we will call you to review the results.  Testing/Procedures: Your physician has requested that you have an echocardiogram. Echocardiography is a painless test that uses sound waves to create images of your heart. It provides your doctor with information about the size and shape of your heart and how well your heart's chambers and valves are working. This procedure takes approximately one hour. There are no restrictions for this procedure. Please do NOT wear cologne, perfume, aftershave, or lotions (deodorant is allowed). Please arrive 15 minutes prior to your appointment time.  Please note: We ask at that you not bring children with you during ultrasound (echo/ vascular) testing. Due to room size and safety concerns, children are not allowed in the ultrasound rooms during exams. Our front office staff cannot provide observation of children in our lobby area while testing is being conducted. An adult accompanying a patient to their appointment will only be allowed in the ultrasound room at the discretion of the ultrasound technician under special circumstances. We apologize for any inconvenience.   Follow-Up: At [facility], you and your health needs are our priority.  As part of our continuing mission to provide you with exceptional heart care, our providers are all part of one team.  This team includes your primary Cardiologist (physician) and Advanced Practice Providers or APPs (Physician Assistants and Nurse Practitioners) who all work together to provide you with the care you need, when you need it.  Your next appointment:   6 month(s)  Provider:   Lonni LITTIE Nanas, MD

## 2024-06-07 ENCOUNTER — Ambulatory Visit: Payer: Self-pay | Admitting: Nurse Practitioner

## 2024-06-07 ENCOUNTER — Encounter: Payer: Self-pay | Admitting: Nurse Practitioner

## 2024-06-07 LAB — C-REACTIVE PROTEIN: CRP: <1 mg/L (ref 0–10)

## 2024-06-07 LAB — SEDIMENTATION RATE: Sed Rate: 2 mm/h (ref 0–15)

## 2024-07-11 ENCOUNTER — Telehealth (HOSPITAL_COMMUNITY): Payer: Self-pay | Admitting: Nurse Practitioner

## 2024-07-11 NOTE — Telephone Encounter (Signed)
 Pt has cancelled  Echocardiogram x 2 VIA automated service. I spoke with him and he is in [place]  and will call back when he can reschedule. Order will be removed from the [facility]. Thank you.

## 2024-07-11 NOTE — Telephone Encounter (Signed)
 Noted

## 2024-07-12 ENCOUNTER — Ambulatory Visit (HOSPITAL_COMMUNITY)

## 2024-09-25 ENCOUNTER — Encounter: Payer: Self-pay | Admitting: Nurse Practitioner

## 2024-11-14 DIAGNOSIS — I300 Acute nonspecific idiopathic pericarditis: Secondary | ICD-10-CM | POA: Diagnosis not present
